# Patient Record
Sex: Female | Born: 1975 | Race: White | Hispanic: No | Marital: Married | Smoking: Former smoker
Health system: Southern US, Community
[De-identification: ages and names within clinical notes are randomized; demographics above are authoritative.]

## PROBLEM LIST (undated history)

## (undated) DIAGNOSIS — F419 Anxiety disorder, unspecified: Secondary | ICD-10-CM

## (undated) DIAGNOSIS — M419 Scoliosis, unspecified: Secondary | ICD-10-CM

## (undated) DIAGNOSIS — T7840XA Allergy, unspecified, initial encounter: Secondary | ICD-10-CM

## (undated) DIAGNOSIS — R921 Mammographic calcification found on diagnostic imaging of breast: Secondary | ICD-10-CM

## (undated) DIAGNOSIS — F329 Major depressive disorder, single episode, unspecified: Secondary | ICD-10-CM

## (undated) DIAGNOSIS — F32A Depression, unspecified: Secondary | ICD-10-CM

## (undated) DIAGNOSIS — R87619 Unspecified abnormal cytological findings in specimens from cervix uteri: Secondary | ICD-10-CM

## (undated) DIAGNOSIS — R112 Nausea with vomiting, unspecified: Secondary | ICD-10-CM

## (undated) DIAGNOSIS — Z9889 Other specified postprocedural states: Secondary | ICD-10-CM

## (undated) HISTORY — DX: Depression, unspecified: F32.A

## (undated) HISTORY — DX: Unspecified abnormal cytological findings in specimens from cervix uteri: R87.619

## (undated) HISTORY — DX: Major depressive disorder, single episode, unspecified: F32.9

## (undated) HISTORY — PX: CERVICAL BIOPSY  W/ LOOP ELECTRODE EXCISION: SUR135

## (undated) HISTORY — PX: BREAST SURGERY: SHX581

## (undated) HISTORY — DX: Allergy, unspecified, initial encounter: T78.40XA

## (undated) HISTORY — PX: OTHER SURGICAL HISTORY: SHX169

## (undated) HISTORY — DX: Scoliosis, unspecified: M41.9

## (undated) HISTORY — DX: Anxiety disorder, unspecified: F41.9

---

## 2001-06-30 ENCOUNTER — Ambulatory Visit (HOSPITAL_BASED_OUTPATIENT_CLINIC_OR_DEPARTMENT_OTHER): Admission: RE | Admit: 2001-06-30 | Discharge: 2001-07-01 | Payer: Self-pay | Admitting: Orthopedic Surgery

## 2001-11-26 HISTORY — PX: ANTERIOR CRUCIATE LIGAMENT REPAIR: SHX115

## 2003-03-29 ENCOUNTER — Other Ambulatory Visit: Admission: RE | Admit: 2003-03-29 | Discharge: 2003-03-29 | Payer: Self-pay | Admitting: Family Medicine

## 2004-08-30 ENCOUNTER — Other Ambulatory Visit: Admission: RE | Admit: 2004-08-30 | Discharge: 2004-08-30 | Payer: Self-pay | Admitting: Family Medicine

## 2005-10-12 ENCOUNTER — Other Ambulatory Visit: Admission: RE | Admit: 2005-10-12 | Discharge: 2005-10-12 | Payer: Self-pay | Admitting: Family Medicine

## 2006-10-29 ENCOUNTER — Other Ambulatory Visit: Admission: RE | Admit: 2006-10-29 | Discharge: 2006-10-29 | Payer: Self-pay | Admitting: Family Medicine

## 2012-10-13 ENCOUNTER — Ambulatory Visit (INDEPENDENT_AMBULATORY_CARE_PROVIDER_SITE_OTHER): Payer: BC Managed Care – PPO | Admitting: Family Medicine

## 2012-10-13 VITALS — BP 106/68 | HR 73 | Temp 98.4°F | Resp 16 | Ht 65.5 in | Wt 170.0 lb

## 2012-10-13 DIAGNOSIS — Z23 Encounter for immunization: Secondary | ICD-10-CM

## 2012-10-13 DIAGNOSIS — L709 Acne, unspecified: Secondary | ICD-10-CM

## 2012-10-13 DIAGNOSIS — J329 Chronic sinusitis, unspecified: Secondary | ICD-10-CM

## 2012-10-13 DIAGNOSIS — L708 Other acne: Secondary | ICD-10-CM

## 2012-10-13 MED ORDER — TRETINOIN 0.025 % EX CREA: TOPICAL_CREAM | Freq: Every day | CUTANEOUS | Status: DC

## 2012-10-13 MED ORDER — AMOXICILLIN 875 MG PO TABS: 875.0000 mg | ORAL_TABLET | Freq: Two times a day (BID) | ORAL | Status: DC

## 2012-10-13 NOTE — Patient Instructions (Signed)

## 2012-10-13 NOTE — Progress Notes (Signed)
@  [email or web address]@   Patient ID: Brittany Gillespie MRN: [id number], DOB: 01/13/1976, 36 y.o. Date of Encounter: 10/13/2012, 4:27 PM  Primary Physician: No primary provider on file.  Chief Complaint:  Chief Complaint  Patient presents with  . Generalized Body Aches    all symptoms began last Thursday- (Pt recently traveled by plane Tues and Wed)  . Cough    Pt taking OTC meds  . Chills  . Nasal Congestion  . Otalgia  . Fatigue    HPI: 36 y.o. year old female presents with 4 day history of nasal congestion, post nasal drip, sore throat, sinus pressure, and cough. Afebrile. No chills. Nasal congestion thick and green/yellow. Sinus pressure is the worst symptom. Cough is productive secondary to post nasal drip and not associated with time of day. Ears feel full, leading to sensation of muffled hearing. Has tried OTC cold preps without success. No GI complaints.   No recent antibiotics, recent travels, or sick contacts   No leg trauma, sedentary periods, h/o cancer, or tobacco use.  Past Medical History  Diagnosis Date  . Allergy   . Anxiety      Home Meds: Prior to Admission medications   Not on File    Allergies: No Known Allergies  History   Social History  . Marital Status: Single    Spouse Name: N/A    Number of Children: N/A  . Years of Education: N/A   Occupational History  . Sales    Social History Main Topics  . Smoking status: Never Smoker   . Smokeless tobacco: Not on file  . Alcohol Use: Yes  . Drug Use: No  . Sexually Active: Not on file   Other Topics Concern  . Not on file   Social History Narrative  . No narrative on file     Review of Systems: Constitutional: positive for chills, night sweats .Negative for weight changes Cardiovascular: negative for chest pain or palpitations Respiratory: negative for hemoptysis, wheezing, or shortness of breath Abdominal: negative for abdominal pain, nausea, vomiting or diarrhea Dermatological: negative for  rash Neurologic: negative for headache   Physical Exam: Blood pressure 106/68, pulse 73, temperature 98.4 F (36.9 C), temperature source Oral, resp. rate 16, height 5' 5.5" (1.664 m), weight 170 lb (77.111 kg), SpO2 98.00%., Body mass index is 27.86 kg/(m^2). General: Well developed, well nourished, in no acute distress. Head: Normocephalic, atraumatic, eyes without discharge, sclera non-icteric, nares are congested. Bilateral auditory canals clear, TM's are without perforation, pearly grey with reflective cone of light bilaterally. Serous effusion bilaterally behind TM's. Maxillary sinus TTP. Oral cavity moist, dentition normal. Posterior pharynx with post nasal drip and mild erythema. No peritonsillar abscess or tonsillar exudate. Neck: Supple. No thyromegaly. Full ROM. No lymphadenopathy. Lungs: Clear bilaterally to auscultation without wheezes, rales, or rhonchi. Breathing is unlabored.  Heart: RRR with S1 S2. No murmurs, rubs, or gallops appreciated. Msk:  Strength and tone normal for age. Extremities: No clubbing or cyanosis. No edema. Neuro: Alert and oriented X 3. Moves all extremities spontaneously. CNII-XII grossly in tact. Psych:  Responds to questions appropriately with a normal affect.    ASSESSMENT AND PLAN:  36 y.o. year old female with sinusitis 1. Sinusitis  amoxicillin (AMOXIL) 875 MG tablet, DISCONTINUED: amoxicillin (AMOXIL) 875 MG tablet    -  -Tylenol/Motrin prn -Rest/fluids -RTC precautions -RTC 3-5 days if no improvement  Signed, Elvina Sidle, MD 10/13/2012 4:27 PM

## 2012-10-15 ENCOUNTER — Telehealth: Payer: Self-pay

## 2012-10-15 NOTE — Telephone Encounter (Signed)
Pt states that her pharmacy informed her that she was waiting on a prior authorization from [place] for one of the two medications we prescribed, pt would like to know that status of that prior authorization. Best# [phone]

## 2012-10-15 NOTE — Progress Notes (Signed)
Called pt and got info about other acne medications she has tried/failed and then completed prior auth form for her Tretinoin cream and faxed to ins w/confirmation.  Received denial letter for medication. Ins does not cover this medication. Notified pt and faxed denial to pharmacy.

## 2012-10-16 ENCOUNTER — Telehealth: Payer: Self-pay

## 2012-10-16 NOTE — Telephone Encounter (Signed)
Called Brittany Gillespie to notify her that her prior auth for tretinoin cream was denied. D/W Brittany Gillespie that she can check costs at various pharmacies to see about paying OOP. Brittany Gillespie stated she will call to check costs. Dr L, is there anything else that we can Rx for Brittany Gillespie? She is currently using the Differin and it is not controlling her acne and she has discussed Doxy or Minocycline before w/providers, but was not put on that because it could interfere w/her BCP.

## 2012-10-16 NOTE — Telephone Encounter (Signed)
Patient advised this was denied

## 2012-10-17 NOTE — Telephone Encounter (Signed)
Clindamycin gel 1% qhs, 60 gms, would be a reasonable substitute.  Ok to call in with 2 RF

## 2012-10-20 MED ORDER — CLINDAMYCIN PHOSPHATE 1 % EX GEL: CUTANEOUS | Status: DC

## 2012-10-20 NOTE — Telephone Encounter (Signed)
Notified pt that new Rx was sent to pharmacy.

## 2013-04-10 ENCOUNTER — Telehealth: Payer: Self-pay | Admitting: Obstetrics and Gynecology

## 2013-04-10 NOTE — Telephone Encounter (Signed)
Advised patient we cannot refill OCP's until we see her.  She has New patient AEX scheduled for 04-13-13.

## 2013-04-10 NOTE — Telephone Encounter (Signed)
Patient called about getting prescription refilled information transferred to another office.

## 2013-04-10 NOTE — Telephone Encounter (Signed)
Patient saw Dr. Edward Jolly for her aex in June 2013 (at the other office). She said Dr. Edward Jolly told her she would not need a pap smear this year. She was wondering if she would be able to get a refill on her birth control. She currently has 1 refill left. Please advise.

## 2013-04-10 NOTE — Telephone Encounter (Signed)
LMTCB. Advised patient that she will need to schedule a NGYN appointment with Dr. Edward Jolly before she can receive a new prescription. Asked her to call back to schedule a new patient appointment.

## 2013-04-13 ENCOUNTER — Encounter: Payer: Self-pay | Admitting: Obstetrics and Gynecology

## 2013-04-13 ENCOUNTER — Ambulatory Visit (INDEPENDENT_AMBULATORY_CARE_PROVIDER_SITE_OTHER): Payer: BC Managed Care – PPO | Admitting: Obstetrics and Gynecology

## 2013-04-13 VITALS — BP 122/76 | Ht 65.5 in | Wt 175.0 lb

## 2013-04-13 DIAGNOSIS — Z3041 Encounter for surveillance of contraceptive pills: Secondary | ICD-10-CM

## 2013-04-13 MED ORDER — NORETHINDRONE 0.35 MG PO TABS: 1.0000 | ORAL_TABLET | Freq: Every day | ORAL | Status: DC

## 2013-04-13 NOTE — Progress Notes (Signed)
Patient ID: Brittany Gillespie, female   DOB: Jul 16, 1976, 37 y.o.   MRN: [id number]  37 y.o.   Single    Caucasian   female   G0P0   here for birth control refill.  Overall is very happy with her progesterone only OCP.  Does not have menses.  Takes on time. Declines future childbearing. Patient notices decreased sex drive last couple of months.  Husband with new job.  Patient with new responsibilities at work. Had cystic acne but this is improved in the last couple of months.   Off Clindamycin.   Quit tobacco one and a half year ago.   Running now.  No LMP recorded. Patient is not currently having periods (Reason: Oral contraceptives).          Sexually active: yes  The current method of family planning is oral progesterone-only contraceptive.    Exercising:  Run, spin, elliptical Last mammogram:   Last pap smear:  June 2013. History of abnormal pap: Years go.  No treatment. Smoking: no Alcohol:  5 wine or beer per week Last tetanus shot:  2005 Last cholesterol check:  June 2013 - normal.      Family History  Problem Relation Age of Onset  . Alzheimer's disease Maternal Grandmother   . Cancer Maternal Grandmother     breast cancer  . Stroke Maternal Grandfather   . Heart disease Maternal Grandfather   . Glaucoma Paternal Grandmother   . Diabetes Paternal Grandfather     There are no active problems to display for this patient.   Past Medical History  Diagnosis Date  . Allergy   . Anxiety   . Depression   . Dysmenorrhea     Past Surgical History  Procedure Laterality Date  . Anterior cruciate ligament repair  2001    Rt knee    Allergies: Review of patient's allergies indicates no known allergies.  Current Outpatient Prescriptions  Medication Sig Dispense Refill  . [name] 0.35 MG tablet 1 tablet daily.      Marland Kitchen loratadine (CLARITIN) 10 MG tablet Take 10 mg by mouth daily.      Marland Kitchen amoxicillin (AMOXIL) 875 MG tablet Take 1 tablet (875 mg total) by mouth 2 (two) times  daily.  20 tablet  0  . clindamycin (CLINDAGEL) 1 % gel Apply topically every night  60 g  2  . tretinoin (RETIN-A) 0.025 % cream Apply topically at bedtime.  45 g  4   No current facility-administered medications for this visit.    ROS: Pertinent items are noted in HPI.  Social Hx:  Married.  Works in Airline pilot.  Exam:    BP 122/76  Ht 5' 5.5" (1.664 m)  Wt 175 lb (79.379 kg)  BMI 28.67 kg/m2   Wt Readings from Last 3 Encounters:  04/13/13 175 lb (79.379 kg)  10/13/12 170 lb (77.111 kg)     Ht Readings from Last 3 Encounters:  04/13/13 5' 5.5" (1.664 m)  10/13/12 5' 5.5" (1.664 m)   Assessment  Need for contraception. Doing well on progesterone only OCP.  Likes amenorrhea. Successful tobacco cessation.  Plan  The patient and I reviewed options for reversible contraception including IUDs. Continue progesterone only OCP.  Rx for 2 months. AEX due in 2 months.  An After Visit Summary was printed and given to the patient.

## 2013-04-13 NOTE — Patient Instructions (Signed)
Continue with your current pill.  I will see you in 2 months for your annual exam.

## 2013-06-03 ENCOUNTER — Other Ambulatory Visit: Payer: Self-pay | Admitting: Obstetrics and Gynecology

## 2013-06-03 NOTE — Telephone Encounter (Signed)
eScribe request for refill on [name] Last filled - 04/13/13 X 2 packs Last AEX - 05/2012 Next AEX - 06/15/13 1 pack sent to pharmacy to make it to appt.

## 2013-06-15 ENCOUNTER — Encounter: Payer: Self-pay | Admitting: Obstetrics and Gynecology

## 2013-06-15 ENCOUNTER — Ambulatory Visit (INDEPENDENT_AMBULATORY_CARE_PROVIDER_SITE_OTHER): Payer: BC Managed Care – PPO | Admitting: Obstetrics and Gynecology

## 2013-06-15 VITALS — BP 120/80 | HR 70 | Ht 65.75 in | Wt 181.5 lb

## 2013-06-15 DIAGNOSIS — Z0000 Encounter for general adult medical examination without abnormal findings: Secondary | ICD-10-CM

## 2013-06-15 DIAGNOSIS — Z01419 Encounter for gynecological examination (general) (routine) without abnormal findings: Secondary | ICD-10-CM

## 2013-06-15 LAB — POCT URINALYSIS DIPSTICK
Blood, UA: NEGATIVE
Protein, UA: NEGATIVE
Urobilinogen, UA: NEGATIVE
pH, UA: 5

## 2013-06-15 LAB — COMPREHENSIVE METABOLIC PANEL
AST: 19 U/L (ref 0–37)
Albumin: 4.3 g/dL (ref 3.5–5.2)
BUN: 13 mg/dL (ref 6–23)
CO2: 26 mEq/L (ref 19–32)
Calcium: 9.5 mg/dL (ref 8.4–10.5)
Chloride: 103 mEq/L (ref 96–112)
Creat: 0.67 mg/dL (ref 0.50–1.10)
Glucose, Bld: 98 mg/dL (ref 70–99)
Potassium: 4 mEq/L (ref 3.5–5.3)

## 2013-06-15 LAB — LIPID PANEL
HDL: 75 mg/dL (ref >39)
Triglycerides: 37 mg/dL (ref <150)

## 2013-06-15 LAB — CBC
HCT: 37.2 % (ref 36.0–46.0)
Hemoglobin: 12.4 g/dL (ref 12.0–15.0)
MCV: 87.7 fL (ref 78.0–100.0)
RDW: 14.1 % (ref 11.5–15.5)
WBC: 7.6 10*3/uL (ref 4.0–10.5)

## 2013-06-15 LAB — HEMOGLOBIN, FINGERSTICK: Hemoglobin, fingerstick: 12.3 g/dL (ref 12.0–16.0)

## 2013-06-15 MED ORDER — NORETHINDRONE 0.35 MG PO TABS: 1.0000 | ORAL_TABLET | Freq: Every day | ORAL | Status: DC

## 2013-06-15 NOTE — Progress Notes (Signed)
Patient ID: Brittany Gillespie, female   DOB: 1976/01/12, 37 y.o.   MRN: [id number] 37 y.o.   Single    Caucasian   female   G0P0   here for annual exam.   Left axillary discomfort - new exercise routine and doing lifting to help her mother with a move to [place]. 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    No LMP recorded. Patient is not currently having periods (Reason: Oral contraceptives).          Sexually active: yes  The current method of family planning is OCP - progesterone only.     Exercising: sping, running, walking and kick boxing. Last mammogram:  never Last pap smear: 04/2012 wnl History of abnormal pap: Yes 8-10 years ago had colposcopy with biopsies.   Had LEEP at [facility].  Paps reverted back to normal. Smoking: former smoker: quit 2013 Marijuana:  occasional Alcohol: 5-6 beers per week Last colonoscopy: never Last Bone Density:  never Last tetanus shot: 08/2004 Last cholesterol check: ?never  Hgb:                Urine: Neg   Family History  Problem Relation Age of Onset  . Alzheimer's disease Maternal Grandmother   . Cancer Maternal Grandmother     breast cancer  . Stroke Maternal Grandfather   . Heart disease Maternal Grandfather   . Glaucoma Paternal Grandmother   . Diabetes Paternal Grandfather     There are no active problems to display for this patient.   Past Medical History  Diagnosis Date  . Allergy   . Anxiety   . Depression   . Dysmenorrhea     Past Surgical History  Procedure Laterality Date  . Anterior cruciate ligament repair  2001    Rt knee    Allergies: Review of patient's allergies indicates no known allergies.  Current Outpatient Prescriptions  Medication Sig Dispense Refill  . clindamycin (CLINDAGEL) 1 % gel Apply topically every night  60 g  2  . ibuprofen (ADVIL,MOTRIN) 200 MG tablet Take 200 mg by mouth every 6 (six) hours as needed for pain.      Marland Kitchen loratadine (CLARITIN) 10  MG tablet Take 10 mg by mouth daily.      Marland Kitchen tretinoin (RETIN-A) 0.025 % cream Apply topically at bedtime.  45 g  4  . amoxicillin (AMOXIL) 875 MG tablet Take 1 tablet (875 mg total) by mouth 2 (two) times daily.  20 tablet  0  . NORA-BE 0.35 MG tablet TAKE 1 TABLET BY MOUTH DAILY  28 tablet  0   No current facility-administered medications for this visit.    ROS: Pertinent items are noted in HPI.  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  Exam:    BP 120/80  Pulse 70  Ht 5' 5.75" (1.67 m)  Wt 181 lb 8 oz (82.328 kg)  BMI 29.52 kg/m2   Wt Readings from Last 3 Encounters:  06/15/13 181 lb 8 oz (82.328 kg)  04/13/13 175 lb (79.379 kg)  10/13/12 170 lb (77.111 kg)     Ht Readings from Last 3 Encounters:  06/15/13 5' 5.75" (1.67 m)  04/13/13 5' 5.5" (1.664 m)  10/13/12 5' 5.5" (1.664 m)    General appearance: alert, cooperative and appears stated age Head: Normocephalic, without obvious abnormality, atraumatic Neck: no adenopathy, supple, symmetrical, trachea midline and thyroid not enlarged, symmetric, no tenderness/mass/nodules Lungs: clear to auscultation bilaterally  Breasts: Inspection negative, No nipple retraction or dimpling, No nipple discharge or bleeding, No axillary or supraclavicular adenopathy, Normal to palpation without dominant masses Heart: regular rate and rhythm Abdomen: soft, non-tender;  no masses,  no organomegaly Extremities: extremities normal, atraumatic, no cyanosis or edema Skin: Skin color, texture, turgor normal. No rashes or lesions Lymph nodes: Cervical, supraclavicular, and axillary nodes normal. No abnormal inguinal nodes palpated Neurologic: Grossly normal   Pelvic: External genitalia:  no lesions              Urethra:  normal appearing urethra with no masses, tenderness or lesions              Bartholins and Skenes: normal                 Vagina: normal appearing vagina with normal color and discharge, no lesions               Cervix: normal appearance              Pap taken: yes and high risk HPV.        Bimanual Exam:  Uterus:  uterus is normal size, shape, consistency and nontender                                      Adnexa: normal adnexa in size, nontender and no masses                                        A: normal  gynecologic exam History of LEEP procedure     P: mammogram age 23 years old. pap smear and high risk HPV annually [name] - progesterone only contraceptive - 3 months with 3 refills. Lipid profile, CBC, CMP. return annually or prn     An After Visit Summary was printed and given to the patient.

## 2013-06-15 NOTE — Patient Instructions (Signed)

## 2013-06-17 NOTE — Addendum Note (Signed)
Addended by: Conley Simmonds on: 06/17/2013 07:41 AM   Modules accepted: Orders

## 2013-06-19 LAB — IPS PAP TEST WITH HPV

## 2013-07-22 ENCOUNTER — Telehealth: Payer: Self-pay | Admitting: Obstetrics and Gynecology

## 2013-10-01 ENCOUNTER — Other Ambulatory Visit: Payer: Self-pay

## 2013-11-12 ENCOUNTER — Telehealth: Payer: Self-pay | Admitting: Obstetrics and Gynecology

## 2013-11-12 NOTE — Telephone Encounter (Signed)
Patient is calling Shanda Bumps to let her know that she spoke with her insurance and they told her the claim was processed 11/09/13 and paid $211.00 with no patient responsibility. Any questions please call back.

## 2014-03-10 ENCOUNTER — Encounter: Payer: Self-pay | Admitting: Obstetrics and Gynecology

## 2014-05-31 ENCOUNTER — Other Ambulatory Visit: Payer: Self-pay | Admitting: Obstetrics and Gynecology

## 2014-05-31 MED ORDER — NORETHINDRONE 0.35 MG PO TABS: 1.0000 | ORAL_TABLET | Freq: Every day | ORAL | Status: DC

## 2014-05-31 NOTE — Telephone Encounter (Signed)
Nora-Be refill request from pharmacy.  Called pt and states she is taking [name].  Nora-Be Refused. [name] (Micronor) refill sent to [facility]. - Patient agreed.  She just needs 2 months worth she'll be coming to her AEX 07/05/14 and would like to talk about getting IUD.  Encounter closed.  Dr. Antony Blackbird

## 2014-06-18 ENCOUNTER — Ambulatory Visit: Payer: BC Managed Care – PPO | Admitting: Obstetrics and Gynecology

## 2014-06-26 DIAGNOSIS — R921 Mammographic calcification found on diagnostic imaging of breast: Secondary | ICD-10-CM

## 2014-06-26 HISTORY — DX: Mammographic calcification found on diagnostic imaging of breast: R92.1

## 2014-07-01 ENCOUNTER — Ambulatory Visit: Payer: BC Managed Care – PPO | Admitting: Obstetrics and Gynecology

## 2014-07-05 ENCOUNTER — Ambulatory Visit (INDEPENDENT_AMBULATORY_CARE_PROVIDER_SITE_OTHER): Payer: BC Managed Care – PPO | Admitting: Obstetrics and Gynecology

## 2014-07-05 ENCOUNTER — Encounter: Payer: Self-pay | Admitting: Obstetrics and Gynecology

## 2014-07-05 VITALS — BP 110/76 | HR 70 | Resp 14 | Ht 65.0 in | Wt 161.0 lb

## 2014-07-05 DIAGNOSIS — Z309 Encounter for contraceptive management, unspecified: Secondary | ICD-10-CM

## 2014-07-05 DIAGNOSIS — R0789 Other chest pain: Secondary | ICD-10-CM

## 2014-07-05 DIAGNOSIS — Z01419 Encounter for gynecological examination (general) (routine) without abnormal findings: Secondary | ICD-10-CM

## 2014-07-05 DIAGNOSIS — R071 Chest pain on breathing: Secondary | ICD-10-CM

## 2014-07-05 DIAGNOSIS — N644 Mastodynia: Secondary | ICD-10-CM

## 2014-07-05 DIAGNOSIS — Z0000 Encounter for general adult medical examination without abnormal findings: Secondary | ICD-10-CM

## 2014-07-05 LAB — POCT URINALYSIS DIPSTICK
BILIRUBIN UA: NEGATIVE
Glucose, UA: NEGATIVE
KETONES UA: NEGATIVE
Leukocytes, UA: NEGATIVE
Nitrite, UA: NEGATIVE
PH UA: 5
Protein, UA: NEGATIVE
RBC UA: NEGATIVE
Urobilinogen, UA: NEGATIVE

## 2014-07-05 LAB — HEMOGLOBIN, FINGERSTICK: Hemoglobin, fingerstick: 12.8 g/dL (ref 12.0–16.0)

## 2014-07-05 NOTE — Patient Instructions (Signed)

## 2014-07-05 NOTE — Progress Notes (Signed)
Patient ID: Brittany Gillespie, female   DOB: [birth year], 38 y.o.   MRN: [id number] GYNECOLOGY VISIT  PCP:   None  Referring provider:   HPI: 38 y.o.   Single  Caucasian  female   G0P0 with No LMP recorded. Patient is not currently having periods (Reason: Oral contraceptives).   here for  AEX.  Wants to consider the Mirena IUD.  Not having menses. Has acne.  Would like to lessen hormonal exposure.   Quit smoking 2 1/2 years ago.   Twinges under left arm, along chest wall and breast area, occurs with movement.  Feels internal. No lump felt in breast or under arm. Feels like a cramp or a twinge.  No prior mammogram.  Maternal grandmother had breast cancer.   Hgb:    12.8 Urine:  Neg  GYNECOLOGIC HISTORY: No LMP recorded. Patient is not currently having periods (Reason: Oral contraceptives). Sexually active:  yes Partner preference: female Contraception:  OCP's  Menopausal hormone therapy: n/a DES exposure:   no Blood transfusions:  no  Sexually transmitted diseases:  HPV in college  GYN procedures and prior surgeries:  LEEP 2005 approximately Last mammogram:  n/a               Last pap and high risk HPV testing:   06-17-13 wnl:neg HR HPV History of abnormal pap smear:  2005 had colposcopy with biopsies.  Had LEEP at [facility].  Paps reverted back to normal.   OB History   Grav Para Term Preterm Abortions TAB SAB Ect Mult Living   0                LIFESTYLE: Exercise:   Spin, walking and kick boxing            Tobacco:   no Alcohol:     8 beers per week Drug use:   Marijuana  occ.  OTHER HEALTH MAINTENANCE: Tetanus/TDap:  2005 Gardisil:              n/a Influenza:            08/2013 Zostavax:            n/a  Bone density:      n/a Colonoscopy:      n/a  Cholesterol check:   2014 normal  Family History  Problem Relation Age of Onset  . Alzheimer's disease Maternal Grandmother   . Cancer Maternal Grandmother     breast cancer  . Stroke Maternal  Grandfather   . Heart disease Maternal Grandfather   . Glaucoma Paternal Grandmother   . Diabetes Paternal Grandfather     There are no active problems to display for this patient.  Past Medical History  Diagnosis Date  . Allergy   . Anxiety   . Depression   . Dysmenorrhea     Past Surgical History  Procedure Laterality Date  . Anterior cruciate ligament repair  2001    Rt knee    ALLERGIES: Review of patient's allergies indicates no known allergies.  Current Outpatient Prescriptions  Medication Sig Dispense Refill  . ibuprofen (ADVIL,MOTRIN) 200 MG tablet Take 200 mg by mouth every 6 (six) hours as needed for pain.      Marland Kitchen loratadine (CLARITIN) 10 MG tablet Take 10 mg by mouth daily.      . norethindrone (MICRONOR,CAMILA,ERRIN) 0.35 MG tablet Take 1 tablet (0.35 mg total) by mouth daily.  3 Package  0   No current facility-administered medications for this  visit.     ROS:  Pertinent items are noted in HPI.  SOCIAL HISTORY:  Works in Nurse, learning disability.   PHYSICAL EXAMINATION:    BP 110/76  Pulse 70  Resp 14  Ht 5' 5"  (1.651 m)  Wt 161 lb (73.029 kg)  BMI 26.79 kg/m2   Wt Readings from Last 3 Encounters:  07/05/14 161 lb (73.029 kg)  06/15/13 181 lb 8 oz (82.328 kg)  04/13/13 175 lb (79.379 kg)     Ht Readings from Last 3 Encounters:  07/05/14 5' 5"  (1.651 m)  06/15/13 5' 5.75" (1.67 m)  04/13/13 5' 5.5" (1.664 m)    General appearance: alert, cooperative and appears stated age Head: Normocephalic, without obvious abnormality, atraumatic Neck: no adenopathy, supple, symmetrical, trachea midline and thyroid not enlarged, symmetric, no tenderness/mass/nodules Lungs: clear to auscultation bilaterally Breasts: Inspection negative, No nipple retraction or dimpling, No nipple discharge or bleeding, No axillary or supraclavicular adenopathy, Normal to palpation without dominant masses Heart: regular rate and rhythm Abdomen: soft, non-tender; no masses,  no  organomegaly Extremities: extremities normal, atraumatic, no cyanosis or edema Skin: Skin color, texture, turgor normal. No rashes or lesions Lymph nodes: Cervical, supraclavicular, and axillary nodes normal. No abnormal inguinal nodes palpated Neurologic: Grossly normal  Pelvic: External genitalia:  no lesions              Urethra:  normal appearing urethra with no masses, tenderness or lesions              Bartholins and Skenes: normal                 Vagina: normal appearing vagina with normal color and discharge, no lesions              Cervix: normal appearance              Pap and high risk HPV testing done: Yes.  .            Bimanual Exam:  Uterus:  uterus is normal size, shape, consistency and nontender                                      Adnexa: normal adnexa in size, nontender and no masses                                      Rectovaginal: Confirms                                      Anus:  normal sphincter tone, no lesions  ASSESSMENT  Normal gynecologic exam. Nulliparous female. History of LEEP.  Former smoker.  Acne with Micronor.  Left chest wall/breast pain.  Family history of breast cancer.   PLAN  Bilateral diagnostic mammogram and left breast ultrasound.  Pap smear and high risk HPV testing performed.  Counseled on self breast exam, Calcium and vitamin D intake. Discussion of Skyla and Mirena - risks and benefits.  Will precert for both and see which is appropriate for patient at the time of insertion.  Will pretreat with Cytotec and Ibuprofen and plan for paracervical block. Lipid profile, CMP, CBC. Return annually or prn   An After Visit Summary was printed and given  to the patient.

## 2014-07-06 ENCOUNTER — Telehealth: Payer: Self-pay | Admitting: Emergency Medicine

## 2014-07-06 LAB — COMPREHENSIVE METABOLIC PANEL
ALK PHOS: 56 U/L (ref 39–117)
ALT: 14 U/L (ref 0–35)
AST: 19 U/L (ref 0–37)
Albumin: 4.5 g/dL (ref 3.5–5.2)
BILIRUBIN TOTAL: 0.5 mg/dL (ref 0.2–1.2)
BUN: 11 mg/dL (ref 6–23)
CHLORIDE: 104 meq/L (ref 96–112)
CO2: 26 mEq/L (ref 19–32)
CREATININE: 0.71 mg/dL (ref 0.50–1.10)
Calcium: 9.1 mg/dL (ref 8.4–10.5)
Glucose, Bld: 91 mg/dL (ref 70–99)
POTASSIUM: 4.1 meq/L (ref 3.5–5.3)
Sodium: 138 mEq/L (ref 135–145)
Total Protein: 6.9 g/dL (ref 6.0–8.3)

## 2014-07-06 LAB — CBC
HEMATOCRIT: 37.2 % (ref 36.0–46.0)
HEMOGLOBIN: 12.7 g/dL (ref 12.0–15.0)
MCH: 30.1 pg (ref 26.0–34.0)
MCHC: 34.1 g/dL (ref 30.0–36.0)
MCV: 88.2 fL (ref 78.0–100.0)
Platelets: 278 10*3/uL (ref 150–400)
RBC: 4.22 MIL/uL (ref 3.87–5.11)
RDW: 13.7 % (ref 11.5–15.5)
WBC: 9.2 10*3/uL (ref 4.0–10.5)

## 2014-07-06 LAB — LIPID PANEL
Cholesterol: 151 mg/dL (ref 0–200)
HDL: 70 mg/dL (ref >39)
LDL CALC: 63 mg/dL (ref 0–99)
Total CHOL/HDL Ratio: 2.2 Ratio
Triglycerides: 90 mg/dL (ref <150)
VLDL: 18 mg/dL (ref 0–40)

## 2014-07-06 NOTE — Telephone Encounter (Signed)
Spoke with patient and she is agreeable with scheduling bilateral dx mammogram and L Breast US at [facility] for 07/09/14 at 0830.   Dx MMG Code Q2229  L Breast Ultrasound [id number]

## 2014-07-06 NOTE — Progress Notes (Signed)
Bilateral dx and L Breast Ultrasound scheduled for 07/09/14 at 0830. Patient agreeable to time/date/location.

## 2014-07-07 LAB — IPS PAP TEST WITH HPV

## 2014-07-08 NOTE — Telephone Encounter (Signed)
Spoke with patient. Advised that per benefit quote received, insertion and removal of iud will be covered at 100% of allowable (she will have 0 oop expense). Advised that per the quote received, the device must be prescrbed and picked up from the pharmacy and brought into our office for insertion. Advised that based on the information received, I am unable to quote benefits regarding coverage of the device itself.  Patient will contact both medical/rx payer for additional details regarding coverage of device. Patient will then, contact our office back for rx if she decides to proceed.

## 2014-07-08 NOTE — Telephone Encounter (Signed)
Patient is calling to check about precert for her IUD

## 2014-07-08 NOTE — Telephone Encounter (Signed)
Patient called. She states she talked with her insurance and Mirena iud is covered at !00% if purchased at outpatient pharmacy.  [facility], they will not stock or order it.  [facility] pharmacy, they will check benefits and call back for order if covered.  Faxed patient insurance cards over to [facility].

## 2014-07-09 ENCOUNTER — Ambulatory Visit
Admission: RE | Admit: 2014-07-09 | Discharge: 2014-07-09 | Disposition: A | Discharge status: Home / Self Care | Payer: BC Managed Care – PPO | Source: Ambulatory Visit | Attending: Obstetrics and Gynecology | Admitting: Obstetrics and Gynecology

## 2014-07-09 DIAGNOSIS — R0789 Other chest pain: Secondary | ICD-10-CM

## 2014-07-09 DIAGNOSIS — N644 Mastodynia: Secondary | ICD-10-CM

## 2014-07-12 NOTE — Telephone Encounter (Signed)
Patient has coverage for future scripts in [place].  Order can be sent escribed when patient ready.  Spoke with patient. She had recent breast imaging and will see general surgery next week.  She will call back when she is ready to place order for Mirena. I advised we will need to place order for Mirena and then she will need to call back when she is on her cycle to place iud. She is agreeable.  Routing to provider for final review. Patient agreeable to disposition. Will close encounter

## 2014-07-12 NOTE — Telephone Encounter (Signed)
Will need to wait for pathology report from the biopsy prior to proceeding with any hormonal prescription.

## 2014-07-22 ENCOUNTER — Ambulatory Visit (INDEPENDENT_AMBULATORY_CARE_PROVIDER_SITE_OTHER): Payer: BC Managed Care – PPO | Admitting: General Surgery

## 2014-07-22 ENCOUNTER — Encounter (INDEPENDENT_AMBULATORY_CARE_PROVIDER_SITE_OTHER): Payer: Self-pay | Admitting: General Surgery

## 2014-07-22 VITALS — BP 118/80 | HR 76 | Temp 98.2°F | Ht 66.0 in | Wt 158.0 lb

## 2014-07-22 DIAGNOSIS — R928 Other abnormal and inconclusive findings on diagnostic imaging of breast: Secondary | ICD-10-CM

## 2014-07-22 DIAGNOSIS — R921 Mammographic calcification found on diagnostic imaging of breast: Secondary | ICD-10-CM | POA: Insufficient documentation

## 2014-07-22 NOTE — Progress Notes (Signed)
Patient ID: Brittany Gillespie, female   DOB: Aug 11, 1976, 38 y.o.   MRN: [id number]  Chief Complaint  Patient presents with  . eval left breast    HPI Brittany Gillespie is a 38 y.o. female.  We are asked to see the patient in consultation by Dr. Derrel Nip to evaluate her for calcifications in her upper outer left breast. The patient is a 38 year old white female who recently complained to her medical doctor of some pain in the left breast. The pain was described as an unusual 8. It as an intermittent pain that comes and goes. She expresses the pain maybe once every week or 2. She has not had any discharge from her nipple. She was evaluated with mammogram that showed some abnormal 1 cm area of calcifications in the upper outer left breast. Unfortunately these were too deep to be biopsied by stereotactic guidance. She is going to require an open biopsy of this area.  HPI  Past Medical History  Diagnosis Date  . Allergy   . Anxiety   . Depression   . Dysmenorrhea     Past Surgical History  Procedure Laterality Date  . Anterior cruciate ligament repair  2001    Rt knee    Family History  Problem Relation Age of Onset  . Alzheimer's disease Maternal Grandmother   . Cancer Maternal Grandmother     breast cancer  . Stroke Maternal Grandfather   . Heart disease Maternal Grandfather   . Glaucoma Paternal Grandmother   . Diabetes Paternal Grandfather     Social History History  Substance Use Topics  . Smoking status: Former Smoker    Quit date: 11/27/2011  . Smokeless tobacco: Not on file  . Alcohol Use: 4.0 oz/week    8 drink(s) per week     Comment: 8 glasses of wine or beers per week    No Known Allergies  Current Outpatient Prescriptions  Medication Sig Dispense Refill  . ibuprofen (ADVIL,MOTRIN) 200 MG tablet Take 200 mg by mouth every 6 (six) hours as needed for pain.      Marland Kitchen loratadine (CLARITIN) 10 MG tablet Take 10 mg by mouth daily.      . norethindrone (MICRONOR,CAMILA,ERRIN)  0.35 MG tablet Take 1 tablet (0.35 mg total) by mouth daily.  3 Package  0   No current facility-administered medications for this visit.    Review of Systems Review of Systems  Constitutional: Negative.   HENT: Negative.   Eyes: Negative.   Respiratory: Negative.   Cardiovascular: Negative.   Gastrointestinal: Negative.   Endocrine: Negative.   Genitourinary: Negative.   Musculoskeletal: Negative.   Skin: Negative.   Allergic/Immunologic: Negative.   Neurological: Negative.   Hematological: Negative.   Psychiatric/Behavioral: Negative.     Blood pressure 118/80, pulse 76, temperature 98.2 F (36.8 C), height 5' 6"  (1.676 m), weight 158 lb (71.668 kg).  Physical Exam Physical Exam  Constitutional: She is oriented to person, place, and time. She appears well-developed and well-nourished.  HENT:  Head: Normocephalic and atraumatic.  Eyes: Conjunctivae and EOM are normal. Pupils are equal, round, and reactive to light.  Neck: Normal range of motion. Neck supple.  Cardiovascular: Normal rate, regular rhythm and normal heart sounds.   Pulmonary/Chest: Effort normal and breath sounds normal.  There is no palpable mass in either breast. There is one small subcentimeter mobile palpable lymph node in the left axilla  Abdominal: Soft. Bowel sounds are normal.  Musculoskeletal: Normal range of motion.  Lymphadenopathy:  She has no cervical adenopathy.  Neurological: She is alert and oriented to person, place, and time.  Skin: Skin is warm and dry.  Psychiatric: She has a normal mood and affect. Her behavior is normal.    Data Reviewed As above  Assessment    The patient appears to have a small area of abnormal calcifications in the upper outer left breast that were not amenable to stereotactic biopsy. Because of this she will require an open biopsy of this area. I think she would be a good candidate for a radioactive seed localized lumpectomy. I have discussed with her in  detail the risks and benefits of the operation to do this as well as some of the technical aspects and she understands and wishes to proceed     Plan    Plan for left breast radioactive seed localized lumpectomy        TOTH III,PAUL S 07/22/2014, 3:31 PM

## 2014-07-26 ENCOUNTER — Encounter: Payer: Self-pay | Admitting: Obstetrics and Gynecology

## 2014-07-26 ENCOUNTER — Encounter (HOSPITAL_BASED_OUTPATIENT_CLINIC_OR_DEPARTMENT_OTHER): Payer: Self-pay | Admitting: *Deleted

## 2014-07-26 ENCOUNTER — Other Ambulatory Visit (INDEPENDENT_AMBULATORY_CARE_PROVIDER_SITE_OTHER): Payer: Self-pay | Admitting: General Surgery

## 2014-07-26 DIAGNOSIS — R921 Mammographic calcification found on diagnostic imaging of breast: Secondary | ICD-10-CM

## 2014-08-03 ENCOUNTER — Ambulatory Visit
Admission: RE | Admit: 2014-08-03 | Discharge: 2014-08-03 | Disposition: A | Discharge status: Home / Self Care | Payer: BC Managed Care – PPO | Source: Ambulatory Visit | Attending: General Surgery | Admitting: General Surgery

## 2014-08-03 DIAGNOSIS — R921 Mammographic calcification found on diagnostic imaging of breast: Secondary | ICD-10-CM

## 2014-08-04 ENCOUNTER — Encounter (HOSPITAL_BASED_OUTPATIENT_CLINIC_OR_DEPARTMENT_OTHER): Payer: Self-pay | Admitting: Certified Registered"

## 2014-08-04 ENCOUNTER — Ambulatory Visit (HOSPITAL_BASED_OUTPATIENT_CLINIC_OR_DEPARTMENT_OTHER)
Admission: RE | Admit: 2014-08-04 | Discharge: 2014-08-04 | Disposition: A | Discharge status: Home / Self Care | Payer: BC Managed Care – PPO | Source: Ambulatory Visit | Attending: General Surgery | Admitting: General Surgery

## 2014-08-04 ENCOUNTER — Ambulatory Visit
Admit: 2014-08-04 | Discharge: 2014-08-04 | Disposition: A | Discharge status: Home / Self Care | Payer: BC Managed Care – PPO | Attending: General Surgery | Admitting: General Surgery

## 2014-08-04 ENCOUNTER — Ambulatory Visit
Admission: RE | Admit: 2014-08-04 | Discharge: 2014-08-04 | Disposition: A | Discharge status: Home / Self Care | Payer: BC Managed Care – PPO | Source: Ambulatory Visit | Attending: General Surgery | Admitting: General Surgery

## 2014-08-04 ENCOUNTER — Ambulatory Visit (HOSPITAL_BASED_OUTPATIENT_CLINIC_OR_DEPARTMENT_OTHER): Payer: BC Managed Care – PPO | Admitting: Certified Registered"

## 2014-08-04 ENCOUNTER — Other Ambulatory Visit: Payer: Self-pay | Admitting: Obstetrics and Gynecology

## 2014-08-04 ENCOUNTER — Encounter (HOSPITAL_BASED_OUTPATIENT_CLINIC_OR_DEPARTMENT_OTHER)
Admission: RE | Disposition: A | Discharge status: Home / Self Care | Payer: Self-pay | Source: Ambulatory Visit | Attending: General Surgery

## 2014-08-04 ENCOUNTER — Encounter (HOSPITAL_BASED_OUTPATIENT_CLINIC_OR_DEPARTMENT_OTHER): Payer: BC Managed Care – PPO | Admitting: Certified Registered"

## 2014-08-04 DIAGNOSIS — R921 Mammographic calcification found on diagnostic imaging of breast: Secondary | ICD-10-CM

## 2014-08-04 DIAGNOSIS — F411 Generalized anxiety disorder: Secondary | ICD-10-CM | POA: Diagnosis not present

## 2014-08-04 DIAGNOSIS — N6019 Diffuse cystic mastopathy of unspecified breast: Secondary | ICD-10-CM | POA: Diagnosis not present

## 2014-08-04 DIAGNOSIS — R928 Other abnormal and inconclusive findings on diagnostic imaging of breast: Secondary | ICD-10-CM | POA: Diagnosis present

## 2014-08-04 DIAGNOSIS — Z87891 Personal history of nicotine dependence: Secondary | ICD-10-CM | POA: Insufficient documentation

## 2014-08-04 DIAGNOSIS — F3289 Other specified depressive episodes: Secondary | ICD-10-CM | POA: Diagnosis not present

## 2014-08-04 DIAGNOSIS — F329 Major depressive disorder, single episode, unspecified: Secondary | ICD-10-CM | POA: Diagnosis not present

## 2014-08-04 HISTORY — DX: Mammographic calcification found on diagnostic imaging of breast: R92.1

## 2014-08-04 HISTORY — DX: Other specified postprocedural states: Z98.890

## 2014-08-04 HISTORY — PX: BREAST LUMPECTOMY WITH RADIOACTIVE SEED LOCALIZATION: SHX6424

## 2014-08-04 HISTORY — DX: Other specified postprocedural states: R11.2

## 2014-08-04 LAB — POCT HEMOGLOBIN-HEMACUE: Hemoglobin: 13.6 g/dL (ref 12.0–15.0)

## 2014-08-04 SURGERY — BREAST LUMPECTOMY WITH RADIOACTIVE SEED LOCALIZATION
Anesthesia: General | Site: Breast | Laterality: Left

## 2014-08-04 MED ORDER — CEFAZOLIN SODIUM-DEXTROSE 2-3 GM-% IV SOLR
INTRAVENOUS | Status: AC
  Filled 2014-08-04: qty 50

## 2014-08-04 MED ORDER — CHLORHEXIDINE GLUCONATE 4 % EX LIQD: 1.0000 "application " | Freq: Once | CUTANEOUS | Status: DC

## 2014-08-04 MED ORDER — OXYCODONE HCL 5 MG PO TABS: 5.0000 mg | ORAL_TABLET | Freq: Once | ORAL | Status: DC | PRN

## 2014-08-04 MED ORDER — SCOPOLAMINE 1 MG/3DAYS TD PT72
1.0000 | MEDICATED_PATCH | TRANSDERMAL | Status: DC
  Administered 2014-08-04: 1.5 mg via TRANSDERMAL

## 2014-08-04 MED ORDER — CEFAZOLIN SODIUM-DEXTROSE 2-3 GM-% IV SOLR
2.0000 g | INTRAVENOUS | Status: AC
  Administered 2014-08-04: 2 g via INTRAVENOUS

## 2014-08-04 MED ORDER — FENTANYL CITRATE 0.05 MG/ML IJ SOLN
INTRAMUSCULAR | Status: DC | PRN
  Administered 2014-08-04 (×4): 50 ug via INTRAVENOUS

## 2014-08-04 MED ORDER — MIDAZOLAM HCL 2 MG/2ML IJ SOLN
INTRAMUSCULAR | Status: AC
  Filled 2014-08-04: qty 2

## 2014-08-04 MED ORDER — PROPOFOL 10 MG/ML IV BOLUS
INTRAVENOUS | Status: AC
  Filled 2014-08-04: qty 20

## 2014-08-04 MED ORDER — FENTANYL CITRATE 0.05 MG/ML IJ SOLN
INTRAMUSCULAR | Status: AC
  Filled 2014-08-04: qty 4

## 2014-08-04 MED ORDER — OXYCODONE-ACETAMINOPHEN 5-325 MG PO TABS: 1.0000 | ORAL_TABLET | ORAL | Status: DC | PRN

## 2014-08-04 MED ORDER — LIDOCAINE HCL (CARDIAC) 20 MG/ML IV SOLN
INTRAVENOUS | Status: DC | PRN
  Administered 2014-08-04: 80 mg via INTRAVENOUS

## 2014-08-04 MED ORDER — BUPIVACAINE HCL (PF) 0.25 % IJ SOLN
INTRAMUSCULAR | Status: AC
  Filled 2014-08-04: qty 30

## 2014-08-04 MED ORDER — SCOPOLAMINE 1 MG/3DAYS TD PT72
MEDICATED_PATCH | TRANSDERMAL | Status: AC
  Filled 2014-08-04: qty 1

## 2014-08-04 MED ORDER — PROPOFOL 10 MG/ML IV BOLUS
INTRAVENOUS | Status: DC | PRN
  Administered 2014-08-04: 180 mg via INTRAVENOUS

## 2014-08-04 MED ORDER — LACTATED RINGERS IV SOLN
INTRAVENOUS | Status: DC
  Administered 2014-08-04 (×2): via INTRAVENOUS

## 2014-08-04 MED ORDER — 0.9 % SODIUM CHLORIDE (POUR BTL) OPTIME
TOPICAL | Status: DC | PRN
  Administered 2014-08-04: 300 mL

## 2014-08-04 MED ORDER — HYDROMORPHONE HCL PF 1 MG/ML IJ SOLN
0.2500 mg | INTRAMUSCULAR | Status: DC | PRN
  Administered 2014-08-04 (×2): 0.5 mg via INTRAVENOUS
  Administered 2014-08-04: 0.25 mg via INTRAVENOUS

## 2014-08-04 MED ORDER — HYDROMORPHONE HCL PF 1 MG/ML IJ SOLN
INTRAMUSCULAR | Status: AC
  Filled 2014-08-04: qty 1

## 2014-08-04 MED ORDER — MIDAZOLAM HCL 5 MG/5ML IJ SOLN
INTRAMUSCULAR | Status: DC | PRN
  Administered 2014-08-04: 2 mg via INTRAVENOUS

## 2014-08-04 MED ORDER — ONDANSETRON HCL 4 MG/2ML IJ SOLN: 4.0000 mg | Freq: Four times a day (QID) | INTRAMUSCULAR | Status: DC | PRN

## 2014-08-04 MED ORDER — DEXAMETHASONE SODIUM PHOSPHATE 4 MG/ML IJ SOLN
INTRAMUSCULAR | Status: DC | PRN
  Administered 2014-08-04: 10 mg via INTRAVENOUS

## 2014-08-04 MED ORDER — BUPIVACAINE HCL (PF) 0.25 % IJ SOLN
INTRAMUSCULAR | Status: DC | PRN
  Administered 2014-08-04: 10 mL

## 2014-08-04 MED ORDER — OXYCODONE HCL 5 MG/5ML PO SOLN: 5.0000 mg | Freq: Once | ORAL | Status: DC | PRN

## 2014-08-04 SURGICAL SUPPLY — 48 items
APPLIER CLIP 9.375 MED OPEN (MISCELLANEOUS)
BINDER BREAST LRG (GAUZE/BANDAGES/DRESSINGS) IMPLANT
BINDER BREAST MEDIUM (GAUZE/BANDAGES/DRESSINGS) IMPLANT
BINDER BREAST XLRG (GAUZE/BANDAGES/DRESSINGS) IMPLANT
BINDER BREAST XXLRG (GAUZE/BANDAGES/DRESSINGS) IMPLANT
BLADE SURG 15 STRL LF DISP TIS (BLADE) ×1 IMPLANT
BLADE SURG 15 STRL SS (BLADE) ×2
CANISTER SUC SOCK COL 7IN (MISCELLANEOUS) ×3 IMPLANT
CANISTER SUCT 1200ML W/VALVE (MISCELLANEOUS) ×3 IMPLANT
CHLORAPREP W/TINT 26ML (MISCELLANEOUS) ×3 IMPLANT
CLIP APPLIE 9.375 MED OPEN (MISCELLANEOUS) IMPLANT
CLIP TI WIDE RED SMALL 6 (CLIP) ×3 IMPLANT
COVER MAYO STAND STRL (DRAPES) ×3 IMPLANT
COVER PROBE W GEL 5X96 (DRAPES) ×3 IMPLANT
COVER TABLE BACK 60X90 (DRAPES) ×3 IMPLANT
DECANTER SPIKE VIAL GLASS SM (MISCELLANEOUS) ×3 IMPLANT
DERMABOND ADVANCED (GAUZE/BANDAGES/DRESSINGS) ×2
DERMABOND ADVANCED .7 DNX12 (GAUZE/BANDAGES/DRESSINGS) ×1 IMPLANT
DEVICE DUBIN W/COMP PLATE 8390 (MISCELLANEOUS) ×3 IMPLANT
DRAPE LAPAROSCOPIC ABDOMINAL (DRAPES) ×3 IMPLANT
DRAPE UTILITY XL STRL (DRAPES) ×3 IMPLANT
ELECT COATED BLADE 2.86 ST (ELECTRODE) ×3 IMPLANT
ELECT REM PT RETURN 9FT ADLT (ELECTROSURGICAL) ×3
ELECTRODE REM PT RTRN 9FT ADLT (ELECTROSURGICAL) ×1 IMPLANT
GLOVE BIO SURGEON STRL SZ7.5 (GLOVE) ×3 IMPLANT
GLOVE BIOGEL M STRL SZ7.5 (GLOVE) ×3 IMPLANT
GLOVE BIOGEL PI IND STRL 8 (GLOVE) ×1 IMPLANT
GLOVE BIOGEL PI INDICATOR 8 (GLOVE) ×2
GOWN STRL REUS W/ TWL LRG LVL3 (GOWN DISPOSABLE) ×2 IMPLANT
GOWN STRL REUS W/ TWL XL LVL3 (GOWN DISPOSABLE) ×1 IMPLANT
GOWN STRL REUS W/TWL LRG LVL3 (GOWN DISPOSABLE) ×4
GOWN STRL REUS W/TWL XL LVL3 (GOWN DISPOSABLE) ×2
KIT MARKER MARGIN INK (KITS) ×3 IMPLANT
NEEDLE HYPO 25X1 1.5 SAFETY (NEEDLE) ×3 IMPLANT
NS IRRIG 1000ML POUR BTL (IV SOLUTION) ×3 IMPLANT
PACK BASIN DAY SURGERY FS (CUSTOM PROCEDURE TRAY) ×3 IMPLANT
PENCIL BUTTON HOLSTER BLD 10FT (ELECTRODE) ×3 IMPLANT
SLEEVE SCD COMPRESS KNEE MED (MISCELLANEOUS) ×3 IMPLANT
SPONGE LAP 18X18 X RAY DECT (DISPOSABLE) ×3 IMPLANT
SUT MON AB 4-0 PC3 18 (SUTURE) ×3 IMPLANT
SUT SILK 2 0 SH (SUTURE) ×3 IMPLANT
SUT VICRYL 3-0 CR8 SH (SUTURE) ×3 IMPLANT
SYR CONTROL 10ML LL (SYRINGE) ×3 IMPLANT
TOWEL OR 17X24 6PK STRL BLUE (TOWEL DISPOSABLE) ×3 IMPLANT
TOWEL OR NON WOVEN STRL DISP B (DISPOSABLE) ×3 IMPLANT
TUBE CONNECTING 20'X1/4 (TUBING) ×1
TUBE CONNECTING 20X1/4 (TUBING) ×2 IMPLANT
YANKAUER SUCT BULB TIP NO VENT (SUCTIONS) ×3 IMPLANT

## 2014-08-04 NOTE — Op Note (Signed)
08/04/2014  2:53 PM  PATIENT:  Brittany Gillespie  38 y.o. female  PRE-OPERATIVE DIAGNOSIS:  Left breast abnormal calcs  POST-OPERATIVE DIAGNOSIS:  Left breast abnormal calcs  PROCEDURE:  Procedure(s): RADIOACTIVE SEED AND WIRE LOCALIZATION LUMPECTOMY LEFT BREAST (Left)  SURGEON:  Surgeon(s) and Role:    * Jovita Kussmaul, MD - Primary  PHYSICIAN ASSISTANT:   ASSISTANTS: none   ANESTHESIA:   general  EBL:  Total I/O In: 1400 [I.V.:1400] Out: -   BLOOD ADMINISTERED:none  DRAINS: none   LOCAL MEDICATIONS USED:  MARCAINE     SPECIMEN:  Source of Specimen:  left breast tissue  DISPOSITION OF SPECIMEN:  PATHOLOGY  COUNTS:  YES  TOURNIQUET:  * No tourniquets in log *  DICTATION: .Dragon Dictation After informed consent was obtained the patient was brought to the operating room and placed in the supine position on the operating room table. After adequate induction of general anesthesia the patient's left breast was prepped with ChloraPrep, dried, and draped in usual sterile manner. Previously a radioactive seed had been placed for an area of abnormal calcification in the upper outer left breast. Unfortunately this seed was approximately a 4-5 cm beyond the area of calcification. Because of this a wire was used to localize the area of abnormal calcification. This was placed by radiology this morning. At this point a small curvilinear incision was made in the upper outer left breast with a 15 blade knife. This incision was carried through the skin and subcutaneous tissue sharply with the electrocautery. Once into the breast tissue the path of the wire could be palpated. A small circular portion of breast tissue was excised sharply with the electrocautery around the path of the wire. Once the specimen was removed it was oriented with a short stitch on the superior surface, a long stitch on the anterior surface, and the wire was entering the specimen laterally. A specimen radiograph showed the  calcifications to be within the specimen. The specimen was then sent to pathology for further evaluation. The location of the seed was determined using the neoprobe set on the iodine 125 setting. The neoprobe was used to help directly dissect through the breast tissue until the area of the seed was located and removed. This was also done sharply with the electrocautery. Once the area with the seed was removed it was placed in a specimen container and a specimen radiograph was obtained confirming that this seed was removed from the patient. There was no further radioactivity in the patient's breast. The specimen with the radioactive seed was sent separately to pathology. Hemostasis was achieved using the Bovie electrocautery. The wound was infiltrated with quarter percent Marcaine and irrigated with saline. The deep layer the wound was closed with interrupted 3-0 Vicryl stitches. The skin was then closed with interrupted 4-0 Monocryl subcuticular stitches. Dermabond dressings were applied. The patient tolerated the procedure well. At the end of the case all needle sponge and instrument counts were correct. The patient was then awakened and taken to recovery in stable condition.  PLAN OF CARE: Discharge to home after PACU  PATIENT DISPOSITION:  PACU - hemodynamically stable.   Delay start of Pharmacological VTE agent (>24hrs) due to surgical blood loss or risk of bleeding: not applicable

## 2014-08-04 NOTE — Transfer of Care (Signed)
Immediate Anesthesia Transfer of Care Note  Patient: Brittany Gillespie  Procedure(s) Performed: Procedure(s): RADIOACTIVE SEED AND WIRE LOCALIZATION LUMPECTOMY LEFT BREAST (Left)  Patient Location: PACU  Anesthesia Type:General  Level of Consciousness: awake, alert , oriented and patient cooperative  Airway & Oxygen Therapy: Patient Spontanous Breathing and Patient connected to face mask oxygen  Post-op Assessment: Report given to PACU RN, Post -op Vital signs reviewed and stable and Patient moving all extremities  Post vital signs: Reviewed and stable  Complications: No apparent anesthesia complications

## 2014-08-04 NOTE — Interval H&P Note (Signed)
History and Physical Interval Note:  08/04/2014 1:29 PM  Brittany Gillespie  has presented today for surgery, with the diagnosis of left breast abnormal calcs  The various methods of treatment have been discussed with the patient and family. After consideration of risks, benefits and other options for treatment, the patient has consented to  Procedure(s):  RADIOACTIVE SEED LOCALIZATION LUMPECTOMY LEFT BREAST (Left) as a surgical intervention .  The patient's history has been reviewed, patient examined, no change in status, stable for surgery.  I have reviewed the patient's chart and labs.  Questions were answered to the patient's satisfaction.     TOTH III,Rufino Staup S

## 2014-08-04 NOTE — Telephone Encounter (Signed)
Last AEX: 07/05/14 Last refill: 05/31/14 #3 packs Current AEX:07/13/15  Pt will be getting an IUD per telephone enounter 07/06/14.   Please advise

## 2014-08-04 NOTE — Anesthesia Procedure Notes (Signed)
Procedure Name: LMA Insertion Date/Time: 08/04/2014 1:50 PM Performed by: Baxter Flattery Pre-anesthesia Checklist: Patient identified, Emergency Drugs available, Suction available and Patient being monitored Patient Re-evaluated:Patient Re-evaluated prior to inductionOxygen Delivery Method: Circle System Utilized Preoxygenation: Pre-oxygenation with 100% oxygen Intubation Type: IV induction Ventilation: Mask ventilation without difficulty LMA: LMA inserted LMA Size: 4.0 Number of attempts: 1 Airway Equipment and Method: bite block Placement Confirmation: positive ETCO2 and breath sounds checked- equal and bilateral Tube secured with: Tape Dental Injury: Teeth and Oropharynx as per pre-operative assessment

## 2014-08-04 NOTE — Anesthesia Preprocedure Evaluation (Signed)
Anesthesia Evaluation  Patient identified by MRN, date of birth, ID band Patient awake    Reviewed: Allergy & Precautions, H&P , NPO status , Patient's Chart, lab work & pertinent test results  History of Anesthesia Complications (+) PONV  Airway Mallampati: II  Neck ROM: full    Dental   Pulmonary former smoker,          Cardiovascular negative cardio ROS      Neuro/Psych    GI/Hepatic   Endo/Other    Renal/GU      Musculoskeletal   Abdominal   Peds  Hematology   Anesthesia Other Findings   Reproductive/Obstetrics                           Anesthesia Physical Anesthesia Plan  ASA: II  Anesthesia Plan: General   Post-op Pain Management:    Induction: Intravenous  Airway Management Planned: LMA  Additional Equipment:   Intra-op Plan:   Post-operative Plan:   Informed Consent: I have reviewed the patients History and Physical, chart, labs and discussed the procedure including the risks, benefits and alternatives for the proposed anesthesia with the patient or authorized representative who has indicated his/her understanding and acceptance.     Plan Discussed with: CRNA, Anesthesiologist and Surgeon  Anesthesia Plan Comments:         Anesthesia Quick Evaluation

## 2014-08-04 NOTE — Telephone Encounter (Signed)
Pt calling for a refill on her birth control Nora-B to [facility] at [phone].

## 2014-08-04 NOTE — Anesthesia Postprocedure Evaluation (Signed)
Anesthesia Post Note  Patient: Brittany Gillespie  Procedure(s) Performed: Procedure(s) (LRB): RADIOACTIVE SEED AND WIRE LOCALIZATION LUMPECTOMY LEFT BREAST (Left)  Anesthesia type: General  Patient location: PACU  Post pain: Pain level controlled and Adequate analgesia  Post assessment: Post-op Vital signs reviewed, Patient's Cardiovascular Status Stable, Respiratory Function Stable, Patent Airway and Pain level controlled  Last Vitals:  Filed Vitals:   08/04/14 1615  BP: 131/69  Pulse: 83  Temp:   Resp: 15    Post vital signs: Reviewed and stable  Level of consciousness: awake, alert  and oriented  Complications: No apparent anesthesia complications

## 2014-08-04 NOTE — H&P (View-Only) (Signed)
Patient ID: Brittany Gillespie, female   DOB: August 05, 1976, 38 y.o.   MRN: [id number]  Chief Complaint  Patient presents with  . eval left breast    HPI Brittany Gillespie is a 38 y.o. female.  We are asked to see the patient in consultation by Dr. Derrel Nip to evaluate her for calcifications in her upper outer left breast. The patient is a 38 year old white female who recently complained to her medical doctor of some pain in the left breast. The pain was described as an unusual 8. It as an intermittent pain that comes and goes. She expresses the pain maybe once every week or 2. She has not had any discharge from her nipple. She was evaluated with mammogram that showed some abnormal 1 cm area of calcifications in the upper outer left breast. Unfortunately these were too deep to be biopsied by stereotactic guidance. She is going to require an open biopsy of this area.  HPI  Past Medical History  Diagnosis Date  . Allergy   . Anxiety   . Depression   . Dysmenorrhea     Past Surgical History  Procedure Laterality Date  . Anterior cruciate ligament repair  2001    Rt knee    Family History  Problem Relation Age of Onset  . Alzheimer's disease Maternal Grandmother   . Cancer Maternal Grandmother     breast cancer  . Stroke Maternal Grandfather   . Heart disease Maternal Grandfather   . Glaucoma Paternal Grandmother   . Diabetes Paternal Grandfather     Social History History  Substance Use Topics  . Smoking status: Former Smoker    Quit date: 11/27/2011  . Smokeless tobacco: Not on file  . Alcohol Use: 4.0 oz/week    8 drink(s) per week     Comment: 8 glasses of wine or beers per week    No Known Allergies  Current Outpatient Prescriptions  Medication Sig Dispense Refill  . ibuprofen (ADVIL,MOTRIN) 200 MG tablet Take 200 mg by mouth every 6 (six) hours as needed for pain.      Marland Kitchen loratadine (CLARITIN) 10 MG tablet Take 10 mg by mouth daily.      . norethindrone (MICRONOR,CAMILA,ERRIN)  0.35 MG tablet Take 1 tablet (0.35 mg total) by mouth daily.  3 Package  0   No current facility-administered medications for this visit.    Review of Systems Review of Systems  Constitutional: Negative.   HENT: Negative.   Eyes: Negative.   Respiratory: Negative.   Cardiovascular: Negative.   Gastrointestinal: Negative.   Endocrine: Negative.   Genitourinary: Negative.   Musculoskeletal: Negative.   Skin: Negative.   Allergic/Immunologic: Negative.   Neurological: Negative.   Hematological: Negative.   Psychiatric/Behavioral: Negative.     Blood pressure 118/80, pulse 76, temperature 98.2 F (36.8 C), height 5' 6"  (1.676 m), weight 158 lb (71.668 kg).  Physical Exam Physical Exam  Constitutional: She is oriented to person, place, and time. She appears well-developed and well-nourished.  HENT:  Head: Normocephalic and atraumatic.  Eyes: Conjunctivae and EOM are normal. Pupils are equal, round, and reactive to light.  Neck: Normal range of motion. Neck supple.  Cardiovascular: Normal rate, regular rhythm and normal heart sounds.   Pulmonary/Chest: Effort normal and breath sounds normal.  There is no palpable mass in either breast. There is one small subcentimeter mobile palpable lymph node in the left axilla  Abdominal: Soft. Bowel sounds are normal.  Musculoskeletal: Normal range of motion.  Lymphadenopathy:  She has no cervical adenopathy.  Neurological: She is alert and oriented to person, place, and time.  Skin: Skin is warm and dry.  Psychiatric: She has a normal mood and affect. Her behavior is normal.    Data Reviewed As above  Assessment    The patient appears to have a small area of abnormal calcifications in the upper outer left breast that were not amenable to stereotactic biopsy. Because of this she will require an open biopsy of this area. I think she would be a good candidate for a radioactive seed localized lumpectomy. I have discussed with her in  detail the risks and benefits of the operation to do this as well as some of the technical aspects and she understands and wishes to proceed     Plan    Plan for left breast radioactive seed localized lumpectomy        TOTH III,PAUL S 07/22/2014, 3:31 PM

## 2014-08-04 NOTE — Discharge Instructions (Signed)

## 2014-08-05 ENCOUNTER — Encounter (HOSPITAL_BASED_OUTPATIENT_CLINIC_OR_DEPARTMENT_OTHER): Payer: Self-pay | Admitting: General Surgery

## 2014-08-05 NOTE — Telephone Encounter (Signed)
Lmom to contact office

## 2014-08-06 ENCOUNTER — Other Ambulatory Visit: Payer: Self-pay

## 2014-08-06 MED ORDER — NORETHINDRONE 0.35 MG PO TABS: 1.0000 | ORAL_TABLET | Freq: Every day | ORAL | Status: DC

## 2014-08-06 NOTE — Telephone Encounter (Signed)
S/W pt and she stated her last pill is tomorrow. She has bad periods and would really like a refill.  She stated she would get the results today or Monday. Results are not in epic yet.

## 2014-08-06 NOTE — Telephone Encounter (Deleted)
Pt called and stated her last pill is tomorrow. She was told that she should get the result today or Monday.  Brittany Gillespie states her periods are really bad and hopes she can get a refill.

## 2014-09-02 ENCOUNTER — Other Ambulatory Visit: Payer: Self-pay | Admitting: Obstetrics and Gynecology

## 2014-09-02 NOTE — Telephone Encounter (Signed)
Last AEX: 07/05/14 Last refill: 08/06/14 1 pack X 0 Current AEX:07/13/15  Please advise

## 2014-09-20 ENCOUNTER — Telehealth: Payer: Self-pay | Admitting: Obstetrics and Gynecology

## 2014-09-20 NOTE — Telephone Encounter (Signed)
Patient calling to schedule Mirena.

## 2014-09-21 NOTE — Telephone Encounter (Signed)
If we have to choose one IUD, I would choose Skyla.  The Skyla does not control cycles as well as the Mirena.  It does have less hormone in it.  The Mirena could be too big.   I am unfamiliar with ordering and sending it to the patient's home.  Should it be sent to our office with her name on it?  OK to place IUD at any time, but patient will need to do a serum Beta HCG two days prior to placement and then abstain until after placement.   I do recommend:  Cytotec 200 mcg orally the night prior to insertion and Cytotec 200 mcg orally the morning of placement  AND a paracervical block at the time of the insertion.

## 2014-09-21 NOTE — Telephone Encounter (Addendum)
Dr. Quincy Simmonds,  In order for IUD to be covered, patient has to have IUD mail ordered to her and bring with her to appointment. We found this out with insurance pre-certification back in August. I wanted to confirm order with you before I placed it. Okay to order Mirena IUD? The IUD order is for Mirena or Isla Pence based on uterine size day of placement.   Also, patient is on continuous ocp and says she has not had a cycle in at least 2 years. Can she have IUD placement at any time?  Nurse Notes: Will need order for Cytotec sent to [place] sent to mail order pharmacy future scripts.

## 2014-09-22 ENCOUNTER — Encounter: Payer: Self-pay | Admitting: Obstetrics and Gynecology

## 2014-09-23 NOTE — Telephone Encounter (Signed)
Message left to return call to [place] at [phone].   Will give message from Dr. Quincy Simmonds and schedule appointment with orders.

## 2014-09-27 NOTE — Telephone Encounter (Signed)
Ok for [employer] 1/20 Fe.  Generic OK.  This is a low dose pill.  Please place order but ask patient if she has another preference.  Patient needs to be aware of warning signs of thrombotic events:  Headache, Sudden shortness of breath, Chest pain, Lower extremity edema and swelling. I would like to see her for a recheck in 3 months to be sure this is a good fit for her.  Thank you!

## 2014-09-27 NOTE — Telephone Encounter (Signed)
Dr. Quincy Simmonds,  Patient returned call and now would like to try ocp prior to Mirena.  Patient sent detailed mychart message.  Please advise.

## 2014-09-29 MED ORDER — DROSPIRENONE-ETHINYL ESTRADIOL 3-0.02 MG PO TABS: 1.0000 | ORAL_TABLET | Freq: Every day | ORAL | Status: DC

## 2014-09-29 NOTE — Telephone Encounter (Signed)
Spoke with Dr. Quincy Simmonds, okay to order continuous Yaz.  Order placed at patient pharmacy of choice for 3 months with no refills. Advised can skip placebo pills and take active pills for 3 months then have one cycle and restart.  Patient given warning signs as instructed below. Patient verbalized understanding of warning signs and agreeable to follow up.  Scheduled for 01/17/14 at 2:45.  Patient has high deductible plan, wondering how Dr. Quincy Simmonds will code visit.  Patient states she has coverage for visits for contraceptive methods and counseling. Advised can discuss with Dr. Quincy Simmonds at time of visit, but would send in this message. Patient agreeable. Will follow up as scheduled or call back with any concerns.  Dr. Quincy Simmonds can you review order and I will close encounter?

## 2014-09-29 NOTE — Telephone Encounter (Signed)
Order for [facility] signed and encounter closed.

## 2014-12-15 ENCOUNTER — Other Ambulatory Visit: Payer: Self-pay | Admitting: Obstetrics and Gynecology

## 2014-12-15 NOTE — Telephone Encounter (Signed)
Medication refill request: Yaz Last AEX:  07/05/14 Next AEX: 07/13/15 Last MMG (if hormonal medication request): MMG Diagnostic Bil 07/09/14 BIRADS4:Suspicious. Specimen radiograph of Left Breast Benign breast tissue.  Refill authorized: 09/29/14 #3pack/0R. Today ? Patient taking NORA-BE?

## 2015-01-17 ENCOUNTER — Encounter: Payer: Self-pay | Admitting: Obstetrics and Gynecology

## 2015-01-17 ENCOUNTER — Ambulatory Visit (INDEPENDENT_AMBULATORY_CARE_PROVIDER_SITE_OTHER): Payer: BLUE CROSS/BLUE SHIELD | Admitting: Obstetrics and Gynecology

## 2015-01-17 VITALS — BP 124/80 | HR 66 | Ht 65.0 in | Wt 166.0 lb

## 2015-01-17 DIAGNOSIS — Z309 Encounter for contraceptive management, unspecified: Secondary | ICD-10-CM

## 2015-01-17 NOTE — Patient Instructions (Signed)
We will plan to insert your IUD during your menstrual cycle.  We will call in the Cytotec to your pharmacy once we have the insertion of the IUD confirmed.

## 2015-01-17 NOTE — Progress Notes (Signed)
Patient ID: Brittany Gillespie, female   DOB: 1976/01/31, 39 y.o.   MRN: [id number] GYNECOLOGY  VISIT   HPI: 39 y.o.   Married  Caucasian  female   G0P0 with Patient's last menstrual period was 12/17/2014 (approximate).   here for 3 month follow up since beginning OCPs.  Has used oral contraceptives for many, many years.  Used combined oral contraceptives but needed to switch to progesterone only OCPs when she reached age 53 due to tobacco use.    Took Micronor and liked not having menses but developed cystic acne, decreased libido, and vaginal dryness.   She quit tobacco 3 years ago.  Had left breast biopsy 08/04/14 - benign fibrocystic change and calcifications.  In November started a combined oral contraceptive again, [place].   Menses only every 3 months, light and no cramps.   No vaginal dryness.    Headaches which are occasional and dull.   Some  mood swings and lethergy.  Wants birth control for contraception.  Declines childbearing.  She and her husband enjoy traveling.  Likes to not have a cycle.   GYNECOLOGIC HISTORY: Patient's last menstrual period was 12/17/2014 (approximate). Contraception:   OCPs--Vestura Menopausal hormone therapy: n/a        OB History    Gravida Para Term Preterm AB TAB SAB Ectopic Multiple Living   0                  Patient Active Problem List   Diagnosis Date Noted  . Breast calcification, left 07/22/2014    Past Medical History  Diagnosis Date  . Breast calcification, left 06/2014  . PONV (postoperative nausea and vomiting)     Past Surgical History  Procedure Laterality Date  . Anterior cruciate ligament repair Right 2003  . Breast lumpectomy with radioactive seed localization Left 08/04/2014    Procedure: RADIOACTIVE SEED AND WIRE LOCALIZATION LUMPECTOMY LEFT BREAST;  Surgeon: Autumn Messing III, MD;  Location: [place];  Service: General;  Laterality: Left;    Current Outpatient Prescriptions  Medication Sig Dispense  Refill  . VESTURA 3-0.02 MG tablet TAKE 1 TABLET BY MOUTH DAILY 84 tablet 2   No current facility-administered medications for this visit.     ALLERGIES: Review of patient's allergies indicates no known allergies.  Family History  Problem Relation Age of Onset  . Alzheimer's disease Maternal Grandmother   . Cancer Maternal Grandmother     breast cancer  . Stroke Maternal Grandfather   . Heart disease Maternal Grandfather   . Glaucoma Paternal Grandmother   . Diabetes Paternal Grandfather     History   Social History  . Marital Status: Married    Spouse Name: N/A  . Number of Children: N/A  . Years of Education: N/A   Occupational History  . Sales    Social History Main Topics  . Smoking status: Former Smoker    Quit date: 11/27/2011  . Smokeless tobacco: Never Used  . Alcohol Use: 0.0 oz/week    0 Standard drinks or equivalent per week     Comment: 1 beer daily  . Drug Use: Yes    Special: Marijuana     Comment: last used 1 week ago  . Sexual Activity:    Partners: Male    Birth Control/ Protection: Pill     Comment: Vestura--takes continuously   Other Topics Concern  . Not on file   Social History Narrative    ROS:  Pertinent items are noted  in HPI.  PHYSICAL EXAMINATION:    BP 124/80 mmHg  Pulse 66  Ht 5' 5"  (1.651 m)  Wt 166 lb (75.297 kg)  BMI 27.62 kg/m2  LMP 12/17/2014 (Approximate)      ASSESSMENT  Encounter for contraceptive counseling.  Need for reliable contraception.  Declines childbearing.  Desire for amenorrhea.  Recent benign left breast biopsy.   PLAN  Discussion of risks and benefits of OCPs - progesterone and combined, Nuva Ring, OrthoEvra, and IUDs. Patient would like to proceed forward with a Mirena IUD.  Will precert.  I will plan to do Cytotec 200 mg the evening prior to and the morning of the insertion.  I will also plan for a paracervical block. Return hopefully with next cycle for IUD insertion.    An After Visit  Summary was printed and given to the patient.  _15_____ minutes face to face time of which over 50% was spent in counseling.

## 2015-01-24 ENCOUNTER — Telehealth: Payer: Self-pay | Admitting: Obstetrics and Gynecology

## 2015-01-24 NOTE — Telephone Encounter (Signed)
Left message for patient to call back. Need to go over contraception benefits.

## 2015-01-25 NOTE — Telephone Encounter (Signed)
Returning a call to [place].

## 2015-01-25 NOTE — Telephone Encounter (Signed)
Spoke with patient. Advised that per benefit quote received, IUD's implants, and other  contraceptive devices are excluded under the patients plan. She will need to use oral contraception only to be covered.  Patient agreeable.   Would like birth control prescription refilled.  Call to [facility]

## 2015-01-25 NOTE — Telephone Encounter (Signed)
Spoke with patient. Patient was seen for annual on 07/05/2014 and 3 month follow up on OCP on 01/17/2015. Patient states that she would like to continue with the current OCP she is on [place]. Advised patient would provide update to Dr.Silva with request for continuation on Vestura. Patient requests rx [facility] on [facility].   Dr.Silva okay to send refills of Vestura until next aex in for patient?

## 2015-01-26 ENCOUNTER — Other Ambulatory Visit: Payer: Self-pay | Admitting: Obstetrics and Gynecology

## 2015-01-26 MED ORDER — DROSPIRENONE-ETHINYL ESTRADIOL 3-0.02 MG PO TABS: 1.0000 | ORAL_TABLET | Freq: Every day | ORAL | Status: DC

## 2015-01-26 NOTE — Telephone Encounter (Signed)
Spoke with patient. Advised ocp has been sent with refills until next annual. Patient is agreeable.  Routing to provider for final review. Patient agreeable to disposition. Will close encounter

## 2015-01-26 NOTE — Telephone Encounter (Signed)
Please inform patient that I have sent a one year Rx for her OCPs to her pharmacy.

## 2015-01-26 NOTE — Telephone Encounter (Signed)
Left message to call Hollie Wojahn at [phone]. 

## 2015-01-28 ENCOUNTER — Telehealth: Payer: Self-pay | Admitting: Obstetrics and Gynecology

## 2015-01-28 NOTE — Telephone Encounter (Signed)
Patient will continue birth control pill. IUD device is not covered by health or rx plan.

## 2015-02-24 ENCOUNTER — Other Ambulatory Visit: Payer: Self-pay | Admitting: Obstetrics and Gynecology

## 2015-02-24 ENCOUNTER — Encounter: Payer: Self-pay | Admitting: Obstetrics and Gynecology

## 2015-02-24 MED ORDER — NORETHIN-ETH ESTRAD-FE BIPHAS 1 MG-10 MCG / 10 MCG PO TABS: 1.0000 | ORAL_TABLET | Freq: Every day | ORAL | Status: DC

## 2015-05-06 ENCOUNTER — Ambulatory Visit (INDEPENDENT_AMBULATORY_CARE_PROVIDER_SITE_OTHER): Payer: BLUE CROSS/BLUE SHIELD | Admitting: Family Medicine

## 2015-05-06 VITALS — BP 122/78 | HR 86 | Temp 98.3°F | Resp 18 | Ht 65.0 in | Wt 165.0 lb

## 2015-05-06 DIAGNOSIS — G4700 Insomnia, unspecified: Secondary | ICD-10-CM

## 2015-05-06 MED ORDER — CITALOPRAM HYDROBROMIDE 20 MG PO TABS: 20.0000 mg | ORAL_TABLET | Freq: Every day | ORAL | Status: DC

## 2015-05-06 MED ORDER — ZOLPIDEM TARTRATE 5 MG PO TABS: 5.0000 mg | ORAL_TABLET | Freq: Every evening | ORAL | Status: DC | PRN

## 2015-05-06 NOTE — Progress Notes (Signed)
Patient ID: Brittany Gillespie, female   DOB: [birth year], 38 y.o.   MRN: [id number]   This chart was scribed for Robyn Haber, MD by [facility], medical scribe at [facility].The patient was seen in exam room 08 and the patient's care was started at 2:50 PM.  Patient ID: Brittany Gillespie MRN: [id number], DOB: 1976/11/24, 39 y.o. Date of Encounter: 05/06/2015  Primary Physician: Arloa Koh, MD  Chief Complaint:  Chief Complaint  Patient presents with   Insomnia    x1 week    Anxiety   HPI:  Brittany Gillespie is a 39 y.o. female who presents to [facility] complaining of anxiety and insomnia.  Anxiety, Insomnia : Ongoing for three months, but worse this past week since she has not slept for seven days. She has had insomnia for several years and takes OTC medications. Pt has taken Lexapro and Ambien in the past for depression and insomnia, she did tolerate these drugs in the past.  She usually falls asleep within 30 min to an hour but wakes up at 2:00 AM and cannot fall back asleep. Pt believes she worries herself because she cannot sleep and she feels like she is not doing well at work despite just getting a raise. She is still exercising, and eating well. Pt is married but has no kids. Her home life is great. Works for a  [employer], she says work life is great. Pt is going to [facility] for vacation next week.  Past Medical History  Diagnosis Date   Breast calcification, left 06/2014   PONV (postoperative nausea and vomiting)    Allergy    Anxiety    Depression     Home Meds: Prior to Admission medications   Medication Sig Start Date End Date Taking? Authorizing Provider  Norethindrone-Ethinyl Estradiol-Fe Biphas (LO LOESTRIN FE) 1 MG-10 MCG / 10 MCG tablet Take 1 tablet by mouth daily. 02/24/15  Yes Brook Oletta Lamas, MD    Allergies: No Known Allergies  History   Social History   Marital Status: Married   Spouse Name: N/A   Number of Children: N/A   Years of Education: N/A   Occupational History   Sales    Social History Main Topics   Smoking status: Former Smoker    Quit date: 11/27/2011   Smokeless tobacco: Never Used   Alcohol Use: 0.0 oz/week    0 Standard drinks or equivalent per week     Comment: 1 beer daily   Drug Use: Yes    Special: Marijuana     Comment: last used 1 week ago   Sexual Activity:    Partners: Male    Patent examiner Protection: Pill     Comment: Vestura--takes continuously   Other Topics Concern   Not on file   Social History Narrative     Review of Systems: Constitutional: negative for chills, fever, night sweats, weight changes, or fatigue  HEENT: negative for vision changes, hearing loss, congestion, rhinorrhea, ST, epistaxis, or sinus pressure Cardiovascular: negative for chest pain or palpitations Respiratory: negative for hemoptysis, wheezing, shortness of breath, or cough Abdominal: negative for abdominal pain, nausea, vomiting, diarrhea, or constipation Dermatological: negative for rash Neurologic: negative for headache, dizziness, or syncope Psych: Positive for anxiety and insomnia  All other systems reviewed and are otherwise negative with the exception to those above and in the HPI.  Physical Exam: Blood pressure 122/78, pulse 86, temperature  98.3 F (36.8 C), temperature source Oral, resp. rate 18, height 5' 5"  (1.651 m), weight 165 lb (74.844 kg), SpO2 99 %., Body mass index is 27.46 kg/(m^2). General: Well developed, well nourished, in no acute distress. Pt is tearful. Head: Normocephalic, atraumatic, eyes without discharge, sclera non-icteric, nares are without discharge. Bilateral auditory canals clear, TM's are without perforation, pearly grey and translucent with reflective cone of light bilaterally. Oral cavity moist, posterior pharynx without exudate, erythema, peritonsillar abscess, or post nasal drip.  Neck: Supple. No  thyromegaly. Full ROM. No lymphadenopathy. Msk:  Strength and tone normal for age. Extremities/Skin: Warm and dry. No clubbing or cyanosis. No edema. No rashes or suspicious lesions. Neuro: Alert and oriented X 3. Moves all extremities spontaneously. Gait is normal. CNII-XII grossly in tact. Psych:  Responds to questions appropriately with a normal affect.     ASSESSMENT AND PLAN:  40 y.o. year old female with   This chart was scribed in my presence and reviewed by me personally.    ICD-9-CM ICD-10-CM   1. Insomnia 780.52 G47.00 citalopram (CELEXA) 20 MG tablet     zolpidem (AMBIEN) 5 MG tablet     Signed, Robyn Haber, MD   No diagnosis found.  Signed, Robyn Haber, MD 05/06/2015 2:50 PM

## 2015-05-06 NOTE — Patient Instructions (Signed)
Insomnia Insomnia is frequent trouble falling and/or staying asleep. Insomnia can be a long term problem or a short term problem. Both are common. Insomnia can be a short term problem when the wakefulness is related to a certain stress or worry. Long term insomnia is often related to ongoing stress during waking hours and/or poor sleeping habits. Overtime, sleep deprivation itself can make the problem worse. Every little thing feels more severe because you are overtired and your ability to cope is decreased. CAUSES   Stress, anxiety, and depression.  Poor sleeping habits.  Distractions such as TV in the bedroom.  Naps close to bedtime.  Engaging in emotionally charged conversations before bed.  Technical reading before sleep.  Alcohol and other sedatives. They may make the problem worse. They can hurt normal sleep patterns and normal dream activity.  Stimulants such as caffeine for several hours prior to bedtime.  Pain syndromes and shortness of breath can cause insomnia.  Exercise late at night.  Changing time zones may cause sleeping problems (jet lag). It is sometimes helpful to have someone observe your sleeping patterns. They should look for periods of not breathing during the night (sleep apnea). They should also look to see how long those periods last. If you live alone or observers are uncertain, you can also be observed at a sleep clinic where your sleep patterns will be professionally monitored. Sleep apnea requires a checkup and treatment. Give your caregivers your medical history. Give your caregivers observations your family has made about your sleep.  SYMPTOMS   Not feeling rested in the morning.  Anxiety and restlessness at bedtime.  Difficulty falling and staying asleep. TREATMENT   Your caregiver may prescribe treatment for an underlying medical disorders. Your caregiver can give advice or help if you are using alcohol or other drugs for self-medication. Treatment  of underlying problems will usually eliminate insomnia problems.  Medications can be prescribed for short time use. They are generally not recommended for lengthy use.  Over-the-counter sleep medicines are not recommended for lengthy use. They can be habit forming.  You can promote easier sleeping by making lifestyle changes such as:  Using relaxation techniques that help with breathing and reduce muscle tension.  Exercising earlier in the day.  Changing your diet and the time of your last meal. No night time snacks.  Establish a regular time to go to bed.  Counseling can help with stressful problems and worry.  Soothing music and white noise may be helpful if there are background noises you cannot remove.  Stop tedious detailed work at least one hour before bedtime. HOME CARE INSTRUCTIONS   Keep a diary. Inform your caregiver about your progress. This includes any medication side effects. See your caregiver regularly. Take note of:  Times when you are asleep.  Times when you are awake during the night.  The quality of your sleep.  How you feel the next day. This information will help your caregiver care for you.  Get out of bed if you are still awake after 15 minutes. Read or do some quiet activity. Keep the lights down. Wait until you feel sleepy and go back to bed.  Keep regular sleeping and waking hours. Avoid naps.  Exercise regularly.  Avoid distractions at bedtime. Distractions include watching television or engaging in any intense or detailed activity like attempting to balance the household checkbook.  Develop a bedtime ritual. Keep a familiar routine of bathing, brushing your teeth, climbing into bed at the same   time each night, listening to soothing music. Routines increase the success of falling to sleep faster.  Use relaxation techniques. This can be using breathing and muscle tension release routines. It can also include visualizing peaceful scenes. You can  also help control troubling or intruding thoughts by keeping your mind occupied with boring or repetitive thoughts like the old concept of counting sheep. You can make it more creative like imagining planting one beautiful flower after another in your backyard garden.  During your day, work to eliminate stress. When this is not possible use some of the previous suggestions to help reduce the anxiety that accompanies stressful situations. MAKE SURE YOU:   Understand these instructions.  Will watch your condition.  Will get help right away if you are not doing well or get worse. Document Released: 11/09/2000 Document Revised: 02/04/2012 Document Reviewed: 12/10/2007 ExitCare Patient Information 2015 [employer]. This information is not intended to replace advice given to you by your health care provider. Make sure you discuss any questions you have with your health care provider.  

## 2015-05-31 ENCOUNTER — Ambulatory Visit (INDEPENDENT_AMBULATORY_CARE_PROVIDER_SITE_OTHER): Payer: BLUE CROSS/BLUE SHIELD | Admitting: Family Medicine

## 2015-05-31 VITALS — BP 112/70 | HR 69 | Temp 98.2°F | Resp 16 | Ht 65.0 in | Wt 163.0 lb

## 2015-05-31 DIAGNOSIS — R05 Cough: Secondary | ICD-10-CM | POA: Diagnosis not present

## 2015-05-31 DIAGNOSIS — J301 Allergic rhinitis due to pollen: Secondary | ICD-10-CM

## 2015-05-31 DIAGNOSIS — R059 Cough, unspecified: Secondary | ICD-10-CM

## 2015-05-31 MED ORDER — TRIAMCINOLONE ACETONIDE 55 MCG/ACT NA AERO: 2.0000 | INHALATION_SPRAY | Freq: Every day | NASAL | Status: DC

## 2015-05-31 MED ORDER — CEFDINIR 300 MG PO CAPS: 300.0000 mg | ORAL_CAPSULE | Freq: Two times a day (BID) | ORAL | Status: DC

## 2015-05-31 MED ORDER — BENZONATATE 100 MG PO CAPS: 100.0000 mg | ORAL_CAPSULE | Freq: Three times a day (TID) | ORAL | Status: DC | PRN

## 2015-05-31 NOTE — Patient Instructions (Signed)
It looks like you do have allergies Add a daily claritin or zyrtec as needed Try the nasacort spray Tessalon perles as needed for cough, delsym is a good OTC syrup If your symptoms do not improve and you are away on vacation, you can use the omnicef antibiotic.  pleese let me know if you do end up using this remember that antibiotics can interfered with your birth control pill

## 2015-05-31 NOTE — Progress Notes (Signed)
[facility] [address] [place] [address] [phone]  Date:  05/31/2015   Name:  Brittany Gillespie   DOB:  Oct 24, 1976   MRN:  [id number]  PCP:  Arloa Koh, MD    Chief Complaint: Cough   History of Present Illness:  Brittany Gillespie is a 39 y.o. very pleasant female patient who presents with the following:  She has noted a "nagging cough" for a week or so. She thought that it might be allergies.  She is traveling next week and wanted to be sure all is well. The cough is sometimes productive of phlegm She has not noted fever or chills, " I feel pretty good overall."  She has noted sneezing. Runny nose, itchy eyes.   No GI symptoms She tried some OTC benadryl, antihistamines. She has not tried any cough syrup.   She does not generally tend to get allergies.   She is going to [place] next week and is worried about trying to seek care while on vacation Former smoker  Patient Active Problem List   Diagnosis Date Noted  . Breast calcification, left 07/22/2014    Past Medical History  Diagnosis Date  . Breast calcification, left 06/2014  . PONV (postoperative nausea and vomiting)   . Allergy   . Anxiety   . Depression     Past Surgical History  Procedure Laterality Date  . Anterior cruciate ligament repair Right 2003  . Breast lumpectomy with radioactive seed localization Left 08/04/2014    Procedure: RADIOACTIVE SEED AND WIRE LOCALIZATION LUMPECTOMY LEFT BREAST;  Surgeon: Autumn Messing III, MD;  Location: [place];  Service: General;  Laterality: Left;  . Breast surgery      History  Substance Use Topics  . Smoking status: Former Smoker    Quit date: 11/27/2011  . Smokeless tobacco: Never Used  . Alcohol Use: 0.0 oz/week    0 Standard drinks or equivalent per week     Comment: 1 beer daily    Family History  Problem Relation Age of Onset  . Alzheimer's disease Maternal Grandmother   . Cancer Maternal Grandmother     breast  cancer  . Stroke Maternal Grandfather   . Heart disease Maternal Grandfather   . Glaucoma Paternal Grandmother   . Diabetes Paternal Grandfather   . Mental illness Father     No Known Allergies  Medication list has been reviewed and updated.  Current Outpatient Prescriptions on File Prior to Visit  Medication Sig Dispense Refill  . citalopram (CELEXA) 20 MG tablet Take 1 tablet (20 mg total) by mouth daily. 30 tablet 5  . Norethindrone-Ethinyl Estradiol-Fe Biphas (LO LOESTRIN FE) 1 MG-10 MCG / 10 MCG tablet Take 1 tablet by mouth daily. 1 Package 4  . zolpidem (AMBIEN) 5 MG tablet Take 1 tablet (5 mg total) by mouth at bedtime as needed for sleep. 30 tablet 5   No current facility-administered medications on file prior to visit.    Review of Systems:  As per HPI- otherwise negative. She is not aware of any sickness at home or work No sinus pain or pressure  Physical Examination: Filed Vitals:   05/31/15 1206  BP: 112/70  Pulse: 69  Temp: 98.2 F (36.8 C)  Resp: 16   Filed Vitals:   05/31/15 1206  Height: 5' 5"  (1.651 m)  Weight: 163 lb (73.936 kg)   Body mass index is 27.12 kg/(m^2). Ideal Body Weight: Weight in (lb)  to have BMI = 25: 149.9  GEN: WDWN, NAD, Non-toxic, A & O x 3, mild overweight, looks well HEENT: Atraumatic, Normocephalic. Neck supple. No masses, No LAD.  Bilateral TM wnl, oropharynx normal.  PEERL,EOMI.   Stringy nasal mucus c/w allergies Ears and Nose: No external deformity. CV: RRR, No M/G/R. No JVD. No thrill. No extra heart sounds. PULM: CTA B, no wheezes, crackles, rhonchi. No retractions. No resp. distress. No accessory muscle use. EXTR: No c/c/e NEURO Normal gait.  PSYCH: Normally interactive. Conversant. Not depressed or anxious appearing.  Calm demeanor.    Assessment and Plan: Allergic rhinitis due to pollen - Plan: triamcinolone (NASACORT) 55 MCG/ACT AERO nasal inhaler  Cough - Plan: cefdinir (OMNICEF) 300 MG capsule, benzonatate  (TESSALON) 100 MG capsule  Likely allergy sx- will add nasacort, tessalon and claritin or zyrtec to her regimen Did give omnicef rx to hold and use if she does not get better or starts to get worse while on vacation- did ask her to let me know if this case. She will do so  Signed Lamar Blinks, MD

## 2015-07-13 ENCOUNTER — Ambulatory Visit: Payer: BC Managed Care – PPO | Admitting: Obstetrics and Gynecology

## 2015-08-03 ENCOUNTER — Ambulatory Visit (INDEPENDENT_AMBULATORY_CARE_PROVIDER_SITE_OTHER): Payer: BLUE CROSS/BLUE SHIELD | Admitting: Family Medicine

## 2015-08-03 VITALS — BP 120/80 | HR 70 | Temp 98.4°F | Resp 16 | Ht 64.75 in | Wt 171.0 lb

## 2015-08-03 DIAGNOSIS — Z23 Encounter for immunization: Secondary | ICD-10-CM

## 2015-08-03 DIAGNOSIS — Z13220 Encounter for screening for lipoid disorders: Secondary | ICD-10-CM | POA: Diagnosis not present

## 2015-08-03 DIAGNOSIS — R350 Frequency of micturition: Secondary | ICD-10-CM

## 2015-08-03 DIAGNOSIS — R5383 Other fatigue: Secondary | ICD-10-CM

## 2015-08-03 DIAGNOSIS — F320 Major depressive disorder, single episode, mild: Secondary | ICD-10-CM | POA: Diagnosis not present

## 2015-08-03 DIAGNOSIS — Z131 Encounter for screening for diabetes mellitus: Secondary | ICD-10-CM | POA: Diagnosis not present

## 2015-08-03 DIAGNOSIS — Z130 Encounter for screening for diseases of the blood and blood-forming organs and certain disorders involving the immune mechanism: Secondary | ICD-10-CM

## 2015-08-03 DIAGNOSIS — Z0000 Encounter for general adult medical examination without abnormal findings: Secondary | ICD-10-CM | POA: Diagnosis not present

## 2015-08-03 LAB — COMPREHENSIVE METABOLIC PANEL
ALBUMIN: 4.2 g/dL (ref 3.6–5.1)
ALT: 16 U/L (ref 6–29)
AST: 24 U/L (ref 10–30)
Alkaline Phosphatase: 41 U/L (ref 33–115)
BUN: 7 mg/dL (ref 7–25)
CHLORIDE: 99 mmol/L (ref 98–110)
CO2: 23 mmol/L (ref 20–31)
CREATININE: 0.58 mg/dL (ref 0.50–1.10)
Calcium: 9.1 mg/dL (ref 8.6–10.2)
Glucose, Bld: 83 mg/dL (ref 65–99)
Potassium: 3.6 mmol/L (ref 3.5–5.3)
Sodium: 134 mmol/L — ABNORMAL LOW (ref 135–146)
Total Bilirubin: 0.6 mg/dL (ref 0.2–1.2)
Total Protein: 7.2 g/dL (ref 6.1–8.1)

## 2015-08-03 LAB — POCT URINALYSIS DIPSTICK
Bilirubin, UA: NEGATIVE
Glucose, UA: NEGATIVE
Ketones, UA: 15
Leukocytes, UA: NEGATIVE
Nitrite, UA: NEGATIVE
Protein, UA: NEGATIVE
RBC UA: NEGATIVE
SPEC GRAV UA: 1.01
UROBILINOGEN UA: 0.2
pH, UA: 6

## 2015-08-03 LAB — CBC
HCT: 36.4 % (ref 36.0–46.0)
Hemoglobin: 12.1 g/dL (ref 12.0–15.0)
MCH: 29.9 pg (ref 26.0–34.0)
MCHC: 33.2 g/dL (ref 30.0–36.0)
MCV: 89.9 fL (ref 78.0–100.0)
MPV: 9.6 fL (ref 8.6–12.4)
PLATELETS: 282 10*3/uL (ref 150–400)
RBC: 4.05 MIL/uL (ref 3.87–5.11)
RDW: 13.1 % (ref 11.5–15.5)
WBC: 10.2 10*3/uL (ref 4.0–10.5)

## 2015-08-03 LAB — POCT UA - MICROSCOPIC ONLY
Bacteria, U Microscopic: NEGATIVE
CRYSTALS, UR, HPF, POC: NEGATIVE
Casts, Ur, LPF, POC: NEGATIVE
Mucus, UA: NEGATIVE
RBC, urine, microscopic: NEGATIVE
WBC, UR, HPF, POC: NEGATIVE
Yeast, UA: NEGATIVE

## 2015-08-03 LAB — LIPID PANEL
CHOL/HDL RATIO: 1.9 ratio (ref <=5.0)
Cholesterol: 177 mg/dL (ref 125–200)
HDL: 93 mg/dL (ref >=46)
LDL Cholesterol: 71 mg/dL (ref <130)
TRIGLYCERIDES: 65 mg/dL (ref <150)
VLDL: 13 mg/dL (ref <30)

## 2015-08-03 LAB — TSH: TSH: 1.544 u[IU]/mL (ref 0.350–4.500)

## 2015-08-03 MED ORDER — BUPROPION HCL ER (SR) 150 MG PO TB12: 150.0000 mg | ORAL_TABLET | Freq: Two times a day (BID) | ORAL | Status: DC

## 2015-08-03 NOTE — Patient Instructions (Signed)
Great to see you today- I will be in touch with your labs asap You got your tetanus and flu shots today We will start you on wellbutrin for depression/ anxiety- take 1 once a day for 3 days, then go to twice a day  Good luck with your weight loss efforts- this is certainly a struggle for many!  Adding some weight training may be helpful for increasing your metabolic rate.  Aim for slow and steady loss- 1 lb a week or so

## 2015-08-03 NOTE — Progress Notes (Signed)
[facility] [address], [place] [id number] [phone]  Date:  08/03/2015   Name:  Brittany Gillespie   DOB:  Nov 30, 1975   MRN:  [id number]  PCP:  Arloa Koh, MD    Chief Complaint: Annual Exam   History of Present Illness:  Brittany Gillespie is a 39 y.o. very pleasant female patient who presents with the following:  Here today for a follow-up visit/ CPE  Reviewed controlled sub database- she has been receiving ambien 5 mg approx every 30 days June, July, and August.  Nothing further back.  She does not need a pap.   History of breast calcification in the past- her current plan of treatment is   Last labs about one year ago  She is on OCP, former but not current smoker mammo last year; she needs annual mammo but nothing further from her breast isuse in 2015  She did eat at 0500 this am- this was almost 8 hours ago now She takes her OCP continuously- does not generally get menses.  Managed by her OBG  She took the celexa for a couple of months but felt like she had weight gain- she stopped using it about 2 weeks ago.   She is frustrated as she is trying to watch her weight and her diet She does still use ambien- feel that being able to get some sleep has helped with her depression sx She is feeling a little anxious and down, but not "super sad like I was before."  She is interested in getting on something such as wellbutrin which is not associated with weight gain Denies any SI  She has noted some mild increased urination so we did a UA and micro today  No pregnancy planned soon  Wt Readings from Last 3 Encounters:  08/03/15 171 lb (77.565 kg)  05/31/15 163 lb (73.936 kg)  05/06/15 165 lb (74.844 kg)     Patient Active Problem List   Diagnosis Date Noted  . Breast calcification, left 07/22/2014    Past Medical History  Diagnosis Date  . Breast calcification, left 06/2014  . PONV (postoperative nausea and vomiting)   . Allergy   . Anxiety   .  Depression     Past Surgical History  Procedure Laterality Date  . Anterior cruciate ligament repair Right 2003  . Breast lumpectomy with radioactive seed localization Left 08/04/2014    Procedure: RADIOACTIVE SEED AND WIRE LOCALIZATION LUMPECTOMY LEFT BREAST;  Surgeon: Autumn Messing III, MD;  Location: [facility];  Service: General;  Laterality: Left;  . Breast surgery      Social History  Substance Use Topics  . Smoking status: Former Smoker    Quit date: 11/27/2011  . Smokeless tobacco: Never Used  . Alcohol Use: 0.0 oz/week    0 Standard drinks or equivalent per week     Comment: 1 beer daily    Family History  Problem Relation Age of Onset  . Alzheimer's disease Maternal Grandmother   . Cancer Maternal Grandmother     breast cancer  . Stroke Maternal Grandfather   . Heart disease Maternal Grandfather   . Glaucoma Paternal Grandmother   . Diabetes Paternal Grandfather   . Mental illness Father     No Known Allergies  Medication list has been reviewed and updated.  Current Outpatient Prescriptions on File Prior to Visit  Medication Sig Dispense Refill  . zolpidem (AMBIEN) 5 MG tablet Take 1 tablet (5  mg total) by mouth at bedtime as needed for sleep. 30 tablet 5   No current facility-administered medications on file prior to visit.    Review of Systems:  As per HPI- otherwise negative.   Physical Examination: Filed Vitals:   08/03/15 1314  BP: 120/80  Pulse: 70  Temp: 98.4 F (36.9 C)  Resp: 16   Filed Vitals:   08/03/15 1314  Height: 5' 4.75" (1.645 m)  Weight: 171 lb (77.565 kg)   Body mass index is 28.66 kg/(m^2). Ideal Body Weight: Weight in (lb) to have BMI = 25: 148.8  GEN: WDWN, NAD, Non-toxic, A & O x 3, mild overweight, looks well HEENT: Atraumatic, Normocephalic. Neck supple. No masses, No LAD.  Bilateral TM wnl, oropharynx normal.  PEERL,EOMI.   Ears and Nose: No external deformity. CV: RRR, No M/G/R. No JVD. No thrill. No  extra heart sounds. PULM: CTA B, no wheezes, crackles, rhonchi. No retractions. No resp. distress. No accessory muscle use. ABD: S, NT, ND EXTR: No c/c/e NEURO Normal gait.  PSYCH: Normally interactive. Conversant. Not depressed or anxious appearing.  Calm demeanor.   Results for orders placed or performed in visit on 08/03/15  POCT urinalysis dipstick  Result Value Ref Range   Color, UA Yellow    Clarity, UA Clear    Glucose, UA Negative    Bilirubin, UA Negative    Ketones, UA 15    Spec Grav, UA 1.010    Blood, UA Negative    pH, UA 6.0    Protein, UA Negative    Urobilinogen, UA 0.2    Nitrite, UA negative    Leukocytes, UA Negative Negative  POCT UA - Microscopic Only  Result Value Ref Range   WBC, Ur, HPF, POC Negative    RBC, urine, microscopic Negative    Bacteria, U Microscopic Negative    Mucus, UA Negative    Epithelial cells, urine per micros 0-1    Crystals, Ur, HPF, POC Negative    Casts, Ur, LPF, POC Negative    Yeast, UA Negative     Assessment and Plan: Physical exam  Screening for hyperlipidemia - Plan: Lipid panel  Screening for diabetes mellitus - Plan: Comprehensive metabolic panel, Hemoglobin A1c  Screening for deficiency anemia - Plan: CBC  Immunization due - Plan: Flu Vaccine QUAD 36+ mos IM, Td vaccine greater than or equal to 7yo preservative free IM  Other fatigue - Plan: TSH  Screening for diabetes mellitus, Active - Plan: Comprehensive metabolic panel, Hemoglobin A1c  Urinary frequency - Plan: POCT urinalysis dipstick, POCT UA - Microscopic Only, Urine culture  Major depressive disorder, single episode, mild - Plan: buPROPion (WELLBUTRIN SR) 150 MG 12 hr tablet  Follow-up pending labs She woud like to use medication for anxiety and depression but seemed to put on 10 lbs while on celexa- she would like to try wellbutrin instead which is fine.  She has been off celexa for about 3 weeks now so can start now- daily for 3 days, then  BID She will keep me posted as to her progress Await urine culture but so far he urine looks fine  Signed Lamar Blinks, MD

## 2015-08-04 LAB — URINE CULTURE
Colony Count: NO GROWTH
ORGANISM ID, BACTERIA: NO GROWTH

## 2015-08-05 LAB — HEMOGLOBIN A1C
Hgb A1c MFr Bld: 5.4 % (ref <5.7)
Mean Plasma Glucose: 108 mg/dL (ref <117)

## 2015-08-15 ENCOUNTER — Other Ambulatory Visit: Payer: Self-pay | Admitting: Obstetrics and Gynecology

## 2015-08-15 NOTE — Telephone Encounter (Signed)
Please contact patient to schedule her annual exam with me! I will refill her OCPs for 2 months.  Her last annual was in September 2015.  She came in for a recheck in February 2016.  Thank you.

## 2015-08-15 NOTE — Telephone Encounter (Signed)
Medication refill request: Nori Riis  Last AEX:  01/17/15 Dr. Quincy Simmonds Next AEX: ? Last MMG (if hormonal medication request): calcifications 09/29/14  Refill authorized: Today please advise.

## 2015-08-17 NOTE — Telephone Encounter (Signed)
Left Voicemail to call back re: AEX.

## 2015-08-19 NOTE — Telephone Encounter (Signed)
Second attempt to contact pt. Left Voicemail to call back to schedule AEX.

## 2015-10-07 ENCOUNTER — Other Ambulatory Visit: Payer: Self-pay | Admitting: Obstetrics and Gynecology

## 2015-10-07 NOTE — Telephone Encounter (Signed)
Medication refill request: Nori Riis Last AEX:  07/05/14 Next AEX: 10/18/15 Last MMG (if hormonal medication request): 08/03/14 MRI of Lft Breast Radioactive seed localization of the left breast. No apparent complications.  The seed is approximately 3.5 cm medial to the calcifications, but is at the proper depth. As Dr. Marlou Starks was not in the office this afternoon and I could not contact him directly, I will page him tomorrow morning to inform him of the above. Refill authorized: 08/15/15 #30 1 refill Today #30 No refills?

## 2015-10-18 ENCOUNTER — Ambulatory Visit (INDEPENDENT_AMBULATORY_CARE_PROVIDER_SITE_OTHER): Payer: BLUE CROSS/BLUE SHIELD | Admitting: Obstetrics and Gynecology

## 2015-10-18 ENCOUNTER — Other Ambulatory Visit: Payer: Self-pay

## 2015-10-18 ENCOUNTER — Encounter: Payer: Self-pay | Admitting: Obstetrics and Gynecology

## 2015-10-18 VITALS — BP 122/84 | HR 80 | Resp 18 | Ht 64.5 in | Wt 170.0 lb

## 2015-10-18 DIAGNOSIS — Z01419 Encounter for gynecological examination (general) (routine) without abnormal findings: Secondary | ICD-10-CM

## 2015-10-18 DIAGNOSIS — Z1231 Encounter for screening mammogram for malignant neoplasm of breast: Secondary | ICD-10-CM

## 2015-10-18 MED ORDER — DROSPIRENONE-ETHINYL ESTRADIOL 3-0.02 MG PO TABS
1.0000 | ORAL_TABLET | Freq: Every day | ORAL | Status: DC
Start: 2015-10-18 — End: 2016-07-02

## 2015-10-18 NOTE — Patient Instructions (Signed)

## 2015-10-18 NOTE — Progress Notes (Signed)
39 y.o. G0P0 Married Caucasian female here for annual exam.   Having some hot flashes.  Wondering if she is going through menopause.  Taking continuous OCPs. Had normal thyroid function with PCP.   Taking Wellbutrin and Ambien for sleep disturbance.  PCP: Lamar Blinks - Did blood work.   Patient's last menstrual period was 08/27/2015.          Sexually active: Yes.    The current method of family planning is OCP (estrogen/progesterone).    Exercising: Yes.    Spin, kickbox, walk, weights Smoker:  Former   Health Maintenance: Pap:  07/05/14 Neg. HR HPV:neg History of abnormal Pap:  Yes, Hx of Leep ~ 10 years ago.  MMG: 07/09/14 Diagnostic bilateral and [place] left BIRADS4: suspicious.  09/29/14 MAMMOGRAPHIC GUIDED RADIOACTIVE SEED LOCALIZATION OF THE LEFT BREAST: Radioactive seed localization of the left breast. No apparent complications. The seed is approximately 3.5 cm medial to the calcifications, but is at the proper depth. Colonoscopy:  Never BMD:   Never TDaP:  2016  Screening Labs:  Hb today: PCP, Urine today: PCP   reports that she quit smoking about 3 years ago. She has never used smokeless tobacco. She reports that she drinks alcohol. She reports that she uses illicit drugs (Marijuana).  Past Medical History  Diagnosis Date  . Breast calcification, left 06/2014  . PONV (postoperative nausea and vomiting)   . Allergy   . Anxiety   . Depression   . Abnormal Pap smear of cervix ~2005    Past Surgical History  Procedure Laterality Date  . Anterior cruciate ligament repair Right 2003  . Breast lumpectomy with radioactive seed localization Left 08/04/2014    Procedure: RADIOACTIVE SEED AND WIRE LOCALIZATION LUMPECTOMY LEFT BREAST;  Surgeon: Autumn Messing III, MD;  Location: [facility];  Service: General;  Laterality: Left;  . Breast surgery    . Cervical biopsy  w/ loop electrode excision  ~ 2005    [facility]     Current Outpatient Prescriptions   Medication Sig Dispense Refill  . buPROPion (WELLBUTRIN SR) 150 MG 12 hr tablet Take 1 tablet (150 mg total) by mouth 2 (two) times daily. 60 tablet 6  . cetirizine (ZYRTEC) 10 MG tablet Take 10 mg by mouth daily as needed for allergies.    Marland Kitchen ibuprofen (ADVIL,MOTRIN) 200 MG tablet Take 200 mg by mouth daily as needed.    . VESTURA 3-0.02 MG tablet TAKE 1 TABLET BY MOUTH EVERY DAY 28 tablet 0  . zolpidem (AMBIEN) 5 MG tablet Take 1 tablet (5 mg total) by mouth at bedtime as needed for sleep. 30 tablet 5   No current facility-administered medications for this visit.    Family History  Problem Relation Age of Onset  . Alzheimer's disease Maternal Grandmother   . Cancer Maternal Grandmother     breast cancer  . Stroke Maternal Grandfather   . Heart disease Maternal Grandfather   . Glaucoma Paternal Grandmother   . Diabetes Paternal Grandfather   . Mental illness Father     ROS:  Pertinent items are noted in HPI.  Otherwise, a comprehensive ROS was negative.  Exam:   BP 122/84 mmHg  Pulse 80  Resp 18  Ht 5' 4.5" (1.638 m)  Wt 170 lb (77.111 kg)  BMI 28.74 kg/m2  LMP 08/27/2015    General appearance: alert, cooperative and appears stated age Head: Normocephalic, without obvious abnormality, atraumatic Neck: no adenopathy, supple, symmetrical, trachea midline and  thyroid normal to inspection and palpation Lungs: clear to auscultation bilaterally Breasts: normal appearance, no masses or tenderness, Inspection negative, No nipple retraction or dimpling, No nipple discharge or bleeding, No axillary or supraclavicular adenopathy Heart: regular rate and rhythm Abdomen: soft, non-tender; bowel sounds normal; no masses,  no organomegaly Extremities: extremities normal, atraumatic, no cyanosis or edema Skin: Skin color, texture, turgor normal. No rashes or lesions Lymph nodes: Cervical, supraclavicular, and axillary nodes normal. No abnormal inguinal nodes palpated Neurologic: Grossly  normal  Pelvic: External genitalia:  no lesions              Urethra:  normal appearing urethra with no masses, tenderness or lesions              Bartholins and Skenes: normal                 Vagina: normal appearing vagina with normal color and discharge, no lesions              Cervix: no lesions              Pap taken: No. Bimanual Exam:  Uterus:  normal size, contour, position, consistency, mobility, non-tender              Adnexa: normal adnexa and no mass, fullness, tenderness              Rectovaginal: Yes.  .  Confirms.              Anus:  normal sphincter tone, no lesions  Chaperone was present for exam.  Assessment:   Well woman visit with normal exam. Vasomotor symptoms.  On combined OCPs. Remote hx of LEEP.   Plan: Yearly mammogram recommended after age 40.  Recommended self breast exam.  Pap and HR HPV as above. Discussed Calcium, Vitamin D, regular exercise program including cardiovascular and weight bearing exercise. Labs performed.  No..   See orders. Refills given on medications.  Yes.  .  See orders.  OCPs for one year.  Declines coming off at this time to check her hormones. Follow up annually and prn.     After visit summary provided.

## 2015-10-19 ENCOUNTER — Ambulatory Visit: Payer: BLUE CROSS/BLUE SHIELD | Admitting: Obstetrics and Gynecology

## 2015-11-01 ENCOUNTER — Other Ambulatory Visit: Payer: Self-pay | Admitting: Family Medicine

## 2015-11-03 ENCOUNTER — Other Ambulatory Visit: Payer: Self-pay | Admitting: Obstetrics and Gynecology

## 2015-11-03 NOTE — Telephone Encounter (Signed)
Faxed

## 2015-11-08 ENCOUNTER — Ambulatory Visit: Payer: Self-pay

## 2015-11-09 ENCOUNTER — Ambulatory Visit
Admission: RE | Admit: 2015-11-09 | Discharge: 2015-11-09 | Disposition: A | Discharge status: Home / Self Care | Payer: BLUE CROSS/BLUE SHIELD | Source: Ambulatory Visit

## 2015-11-09 DIAGNOSIS — Z1231 Encounter for screening mammogram for malignant neoplasm of breast: Secondary | ICD-10-CM

## 2015-12-08 ENCOUNTER — Other Ambulatory Visit: Payer: Self-pay | Admitting: Family Medicine

## 2015-12-12 NOTE — Telephone Encounter (Signed)
rx faxed

## 2016-01-17 ENCOUNTER — Other Ambulatory Visit: Payer: Self-pay | Admitting: Family Medicine

## 2016-01-18 NOTE — Telephone Encounter (Signed)
Faxed

## 2016-02-16 ENCOUNTER — Other Ambulatory Visit: Payer: Self-pay | Admitting: Family Medicine

## 2016-02-17 NOTE — Telephone Encounter (Signed)
Faxed

## 2016-02-28 ENCOUNTER — Other Ambulatory Visit: Payer: Self-pay | Admitting: Family Medicine

## 2016-03-20 ENCOUNTER — Other Ambulatory Visit: Payer: Self-pay | Admitting: Family Medicine

## 2016-03-21 NOTE — Telephone Encounter (Signed)
Called in.

## 2016-06-21 ENCOUNTER — Other Ambulatory Visit: Payer: Self-pay | Admitting: Family Medicine

## 2016-07-02 ENCOUNTER — Other Ambulatory Visit: Payer: Self-pay | Admitting: Obstetrics and Gynecology

## 2016-07-02 NOTE — Telephone Encounter (Signed)
Medication refill request: VESTURA 3-0.02 MG Last AEX:  10/18/15 BS Next AEX: 11/09/16 Last MMG (if hormonal medication request): 11/09/15 BI-RADS1 negative Refill authorized: 10/18/15 #3packs w/3 refills; today #3packs w/1 refill? Please advise

## 2016-07-24 ENCOUNTER — Encounter: Payer: Self-pay | Admitting: Family Medicine

## 2016-07-24 ENCOUNTER — Other Ambulatory Visit: Payer: Self-pay | Admitting: Family Medicine

## 2016-07-24 ENCOUNTER — Other Ambulatory Visit: Payer: Self-pay | Admitting: Emergency Medicine

## 2016-07-24 MED ORDER — BUPROPION HCL ER (SR) 150 MG PO TB12: ORAL_TABLET | ORAL | 3 refills | Status: DC

## 2016-07-24 NOTE — Telephone Encounter (Signed)
Received refill request for Wellbutrin. Last office visit 08/03/15 and last refill on 02/28/16. Is it ok for me to refill this medication? Please advise.

## 2016-08-17 ENCOUNTER — Encounter: Payer: Self-pay | Admitting: Family Medicine

## 2016-08-17 MED ORDER — ZOLPIDEM TARTRATE 5 MG PO TABS: 5.0000 mg | ORAL_TABLET | Freq: Every evening | ORAL | 0 refills | Status: DC | PRN

## 2016-09-17 ENCOUNTER — Encounter: Payer: Self-pay | Admitting: Family Medicine

## 2016-09-17 ENCOUNTER — Ambulatory Visit (INDEPENDENT_AMBULATORY_CARE_PROVIDER_SITE_OTHER): Payer: BLUE CROSS/BLUE SHIELD | Admitting: Family Medicine

## 2016-09-17 VITALS — BP 120/88 | HR 86 | Temp 98.5°F | Ht 64.75 in | Wt 174.4 lb

## 2016-09-17 DIAGNOSIS — Z1329 Encounter for screening for other suspected endocrine disorder: Secondary | ICD-10-CM | POA: Diagnosis not present

## 2016-09-17 DIAGNOSIS — Z131 Encounter for screening for diabetes mellitus: Secondary | ICD-10-CM | POA: Diagnosis not present

## 2016-09-17 DIAGNOSIS — Z0000 Encounter for general adult medical examination without abnormal findings: Secondary | ICD-10-CM

## 2016-09-17 DIAGNOSIS — F320 Major depressive disorder, single episode, mild: Secondary | ICD-10-CM

## 2016-09-17 DIAGNOSIS — Z23 Encounter for immunization: Secondary | ICD-10-CM

## 2016-09-17 DIAGNOSIS — Z13220 Encounter for screening for lipoid disorders: Secondary | ICD-10-CM | POA: Diagnosis not present

## 2016-09-17 DIAGNOSIS — E663 Overweight: Secondary | ICD-10-CM

## 2016-09-17 DIAGNOSIS — F32A Depression, unspecified: Secondary | ICD-10-CM | POA: Insufficient documentation

## 2016-09-17 DIAGNOSIS — Z130 Encounter for screening for diseases of the blood and blood-forming organs and certain disorders involving the immune mechanism: Secondary | ICD-10-CM | POA: Diagnosis not present

## 2016-09-17 DIAGNOSIS — G4700 Insomnia, unspecified: Secondary | ICD-10-CM | POA: Insufficient documentation

## 2016-09-17 DIAGNOSIS — F5101 Primary insomnia: Secondary | ICD-10-CM

## 2016-09-17 LAB — COMPREHENSIVE METABOLIC PANEL
ALBUMIN: 4.1 g/dL (ref 3.5–5.2)
ALK PHOS: 42 U/L (ref 39–117)
ALT: 21 U/L (ref 0–35)
AST: 20 U/L (ref 0–37)
BILIRUBIN TOTAL: 0.4 mg/dL (ref 0.2–1.2)
BUN: 8 mg/dL (ref 6–23)
CALCIUM: 9.4 mg/dL (ref 8.4–10.5)
CO2: 26 mEq/L (ref 19–32)
Chloride: 101 mEq/L (ref 96–112)
Creatinine, Ser: 0.69 mg/dL (ref 0.40–1.20)
GFR: 99.37 mL/min (ref >60.00)
GLUCOSE: 93 mg/dL (ref 70–99)
POTASSIUM: 3.7 meq/L (ref 3.5–5.1)
Sodium: 135 mEq/L (ref 135–145)
TOTAL PROTEIN: 7.3 g/dL (ref 6.0–8.3)

## 2016-09-17 LAB — LIPID PANEL
CHOLESTEROL: 179 mg/dL (ref 0–200)
HDL: 84.2 mg/dL (ref >39.00)
LDL Cholesterol: 76 mg/dL (ref 0–99)
NONHDL: 95.28
Total CHOL/HDL Ratio: 2
Triglycerides: 94 mg/dL (ref 0.0–149.0)
VLDL: 18.8 mg/dL (ref 0.0–40.0)

## 2016-09-17 LAB — CBC
HEMATOCRIT: 36.5 % (ref 36.0–46.0)
HEMOGLOBIN: 12.4 g/dL (ref 12.0–15.0)
MCHC: 34 g/dL (ref 30.0–36.0)
MCV: 88.7 fl (ref 78.0–100.0)
PLATELETS: 268 10*3/uL (ref 150.0–400.0)
RBC: 4.12 Mil/uL (ref 3.87–5.11)
RDW: 13.2 % (ref 11.5–15.5)
WBC: 8.1 10*3/uL (ref 4.0–10.5)

## 2016-09-17 LAB — HEMOGLOBIN A1C: Hgb A1c MFr Bld: 5.6 % (ref 4.6–6.5)

## 2016-09-17 LAB — TSH: TSH: 1.55 u[IU]/mL (ref 0.35–4.50)

## 2016-09-17 MED ORDER — BUPROPION HCL ER (SR) 150 MG PO TB12: ORAL_TABLET | ORAL | 3 refills | Status: DC

## 2016-09-17 MED ORDER — ZOLPIDEM TARTRATE 5 MG PO TABS: 5.0000 mg | ORAL_TABLET | Freq: Every evening | ORAL | 1 refills | Status: DC | PRN

## 2016-09-17 NOTE — Progress Notes (Signed)
Pre visit review using our clinic review tool, if applicable. No additional management support is needed unless otherwise documented below in the visit note. 

## 2016-09-17 NOTE — Patient Instructions (Signed)
It was very nice to see you again today- I will be in touch with your labs and will see you at spin class!  Your BP looks fine today

## 2016-09-17 NOTE — Progress Notes (Signed)
[facility] [address], [place], [place] [address] [phone]  Date:  09/17/2016   Name:  Brittany Gillespie   DOB:  Sep 25, 1976   MRN:  [id number]  PCP:  Arloa Koh, MD    Chief Complaint: Annual Exam (Pt here for annual. Will see GYN in Dec. Flu vaccine today. Will need refills today. )   History of Present Illness:  Brittany Gillespie is a 40 y.o. very pleasant female patient who presents with the following:  Here today for a CPE.  She does see OBG and will see them in December Flu shot today  We changed from celexa to wellbutrin last year in hopes that it would help with weight loss.  She is traveling a lot for work- this is stressful but not so bad.  She has started a fall fitness challenge at the gym (we often see each other in class) and is enjoying this  She still uses [place] as needed for sleep; she may use it 1-2x a week.  Uses it more when she travels  She is taking wellbutrin- she still feels "fine," her energy level is ok.   She is using OCP from her GYN She is fasting today for labs  Former smoker but has quit She does not have any other concerns today   Wt Readings from Last 3 Encounters:  09/17/16 174 lb 6.4 oz (79.1 kg)  10/18/15 170 lb (77.1 kg)  08/03/15 171 lb (77.6 kg)   BP Readings from Last 3 Encounters:  09/17/16 120/88  10/18/15 122/84  08/03/15 120/80    Patient Active Problem List   Diagnosis Date Noted  . Breast calcification, left 07/22/2014    Past Medical History:  Diagnosis Date  . Abnormal Pap smear of cervix ~2005  . Allergy   . Anxiety   . Breast calcification, left 06/2014  . Depression   . PONV (postoperative nausea and vomiting)     Past Surgical History:  Procedure Laterality Date  . ANTERIOR CRUCIATE LIGAMENT REPAIR Right 2003  . BREAST LUMPECTOMY WITH RADIOACTIVE SEED LOCALIZATION Left 08/04/2014   Procedure: RADIOACTIVE SEED AND WIRE LOCALIZATION LUMPECTOMY  LEFT BREAST;  Surgeon: Autumn Messing III, MD;  Location: [facility];  Service: General;  Laterality: Left;  . BREAST SURGERY    . CERVICAL BIOPSY  W/ LOOP ELECTRODE EXCISION  ~ 2005   [facility]     Social History  Substance Use Topics  . Smoking status: Former Smoker    Quit date: 11/27/2011  . Smokeless tobacco: Never Used  . Alcohol use 0.0 oz/week     Comment: 1 beer daily    Family History  Problem Relation Age of Onset  . Alzheimer's disease Maternal Grandmother   . Cancer Maternal Grandmother     breast cancer  . Stroke Maternal Grandfather   . Heart disease Maternal Grandfather   . Glaucoma Paternal Grandmother   . Diabetes Paternal Grandfather   . Mental illness Father     No Known Allergies  Medication list has been reviewed and updated.  Current Outpatient Prescriptions on File Prior to Visit  Medication Sig Dispense Refill  . buPROPion (WELLBUTRIN SR) 150 MG 12 hr tablet TAKE 1 TABLET(150 MG) BY MOUTH TWICE DAILY 60 tablet 3  . cetirizine (ZYRTEC) 10 MG tablet Take 10 mg by mouth daily as needed for allergies.    Marland Kitchen ibuprofen (ADVIL,MOTRIN) 200 MG tablet  Take 200 mg by mouth daily as needed.    . VESTURA 3-0.02 MG tablet TAKE 1 TABLET BY MOUTH EVERY DAY 84 tablet 1  . zolpidem (AMBIEN) 5 MG tablet Take 1 tablet (5 mg total) by mouth at bedtime as needed. for sleep 30 tablet 0   No current facility-administered medications on file prior to visit.     Review of Systems:  As per HPI- otherwise negative. No fever, chills, nausea, vomiting, CP or SOB   Physical Examination: Blood pressure 120/88, pulse 86, temperature 98.5 F (36.9 C), temperature source Oral, height 5' 4.75" (1.645 m), weight 174 lb 6.4 oz (79.1 kg), SpO2 99 %.  Vitals:   09/17/16 0954  Weight: 174 lb 6.4 oz (79.1 kg)  Height: 5' 4.75" (1.645 m)   Body mass index is 29.25 kg/m. Ideal Body Weight: Weight in (lb) to have BMI = 25: 148.8  GEN: WDWN, NAD, Non-toxic, A  & O x 3, overweight but OW looks well HEENT: Atraumatic, Normocephalic. Neck supple. No masses, No LAD.  Bilateral TM wnl, oropharynx normal.  PEERL,EOMI.   Ears and Nose: No external deformity. CV: RRR, No M/G/R. No JVD. No thrill. No extra heart sounds. PULM: CTA B, no wheezes, crackles, rhonchi. No retractions. No resp. distress. No accessory muscle use. ABD: S, NT, ND EXTR: No c/c/e NEURO Normal gait.  PSYCH: Normally interactive. Conversant. Not depressed or anxious appearing.  Calm demeanor.    Assessment and Plan: Physical exam  Screening for deficiency anemia - Plan: CBC  Screening for hyperlipidemia - Plan: Lipid panel  Screening for diabetes mellitus - Plan: Comprehensive metabolic panel, Hemoglobin A1C  Screening for thyroid disorder - Plan: TSH  Major depressive disorder, single episode, mild ([place]) - Plan: buPROPion (WELLBUTRIN SR) 150 MG 12 hr tablet  Primary insomnia - Plan: zolpidem (AMBIEN) 5 MG tablet  Encounter for immunization - Plan: Flu Vaccine QUAD 36+ mos IM  Labs as above- Will plan further follow- up pending labs. Refilled her [place] and wellbutrin today  Signed Lamar Blinks, MD

## 2016-09-24 ENCOUNTER — Ambulatory Visit (INDEPENDENT_AMBULATORY_CARE_PROVIDER_SITE_OTHER): Payer: BLUE CROSS/BLUE SHIELD | Admitting: Sports Medicine

## 2016-09-24 ENCOUNTER — Encounter: Payer: Self-pay | Admitting: Sports Medicine

## 2016-09-24 DIAGNOSIS — G8929 Other chronic pain: Secondary | ICD-10-CM | POA: Diagnosis not present

## 2016-09-24 DIAGNOSIS — M25562 Pain in left knee: Secondary | ICD-10-CM | POA: Insufficient documentation

## 2016-09-24 NOTE — Progress Notes (Signed)
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 Surgical scar R Palpation normal with no warmth or joint line tenderness or patellar tenderness or condyle tenderness. ROM normal in flexion and extension and lower leg rotation. Ligaments with solid consistent endpoints including ACL, PCL, LCL, MCL. Negative Mcmurray's and provocative meniscal tests. Non painful patellar compression but she does have patellar crepitation  Patellar and quadriceps tendons unremarkable. Hamstring and quadriceps strength is normal.  Has significant weakness of left hip abductor Dynamic 1 leg squat and step down - less stable on left

## 2016-09-24 NOTE — Assessment & Plan Note (Signed)
She needs a hip series to work on abduction Do these daily Add some 1 foot quad exercsies with minisquats/ wall slides  Reck 6 weeks Reassured that I think she can get back to running and other activities without an unstable knee

## 2016-10-05 ENCOUNTER — Other Ambulatory Visit: Payer: Self-pay | Admitting: Obstetrics and Gynecology

## 2016-10-05 DIAGNOSIS — Z1231 Encounter for screening mammogram for malignant neoplasm of breast: Secondary | ICD-10-CM

## 2016-11-07 ENCOUNTER — Ambulatory Visit: Payer: BLUE CROSS/BLUE SHIELD | Admitting: Sports Medicine

## 2016-11-09 ENCOUNTER — Ambulatory Visit: Payer: BLUE CROSS/BLUE SHIELD | Admitting: Obstetrics and Gynecology

## 2016-11-12 ENCOUNTER — Ambulatory Visit
Admission: RE | Admit: 2016-11-12 | Discharge: 2016-11-12 | Disposition: A | Discharge status: Home / Self Care | Payer: BLUE CROSS/BLUE SHIELD | Source: Ambulatory Visit | Attending: Obstetrics and Gynecology | Admitting: Obstetrics and Gynecology

## 2016-11-12 DIAGNOSIS — Z1231 Encounter for screening mammogram for malignant neoplasm of breast: Secondary | ICD-10-CM

## 2016-11-22 NOTE — Progress Notes (Deleted)
40 y.o. G0P0 Married Caucasian female here for annual exam.    PCP:     No LMP recorded. Patient is not currently having periods (Reason: Oral contraceptives).           Sexually active: {yes no:314532}  The current method of family planning is {contraception:315051}.    Exercising: {yes ML:465035}  {types:19826} Smoker:  Former  Health Maintenance: Pap:  07-05-14 Neg:Neg HR HPV History of abnormal Pap:  Yes, Hx of LEEP 2006 MMG:  11-12-16 Density C/Neg/BiRads1:TBC Colonoscopy: n/a  BMD:   n/a  Result  n/a TDaP:  2016 Gardasil:   N/A HIV: Hep C: Screening Labs:  Hb today: ***, Urine today: ***   reports that she quit smoking about 4 years ago. She has never used smokeless tobacco. She reports that she drinks alcohol. She reports that she uses drugs, including Marijuana.  Past Medical History:  Diagnosis Date  . Abnormal Pap smear of cervix ~2005  . Allergy   . Anxiety   . Breast calcification, left 06/2014  . Depression   . PONV (postoperative nausea and vomiting)     Past Surgical History:  Procedure Laterality Date  . ANTERIOR CRUCIATE LIGAMENT REPAIR Right 2003  . BREAST LUMPECTOMY WITH RADIOACTIVE SEED LOCALIZATION Left 08/04/2014   Procedure: RADIOACTIVE SEED AND WIRE LOCALIZATION LUMPECTOMY LEFT BREAST;  Surgeon: Autumn Messing III, MD;  Location: [place];  Service: General;  Laterality: Left;  . BREAST SURGERY    . CERVICAL BIOPSY  W/ LOOP ELECTRODE EXCISION  ~ 2005   [facility]     Current Outpatient Prescriptions  Medication Sig Dispense Refill  . buPROPion (WELLBUTRIN SR) 150 MG 12 hr tablet TAKE 1 TABLET(150 MG) BY MOUTH TWICE DAILY 180 tablet 3  . cetirizine (ZYRTEC) 10 MG tablet Take 10 mg by mouth daily as needed for allergies.    Marland Kitchen ibuprofen (ADVIL,MOTRIN) 200 MG tablet Take 200 mg by mouth daily as needed.    . VESTURA 3-0.02 MG tablet TAKE 1 TABLET BY MOUTH EVERY DAY 84 tablet 1  . zolpidem (AMBIEN) 5 MG tablet Take 1 tablet (5  mg total) by mouth at bedtime as needed. for sleep 30 tablet 1   No current facility-administered medications for this visit.     Family History  Problem Relation Age of Onset  . Alzheimer's disease Maternal Grandmother   . Cancer Maternal Grandmother     breast cancer  . Stroke Maternal Grandfather   . Heart disease Maternal Grandfather   . Glaucoma Paternal Grandmother   . Diabetes Paternal Grandfather   . Mental illness Father     ROS:  Pertinent items are noted in HPI.  Otherwise, a comprehensive ROS was negative.  Exam:   There were no vitals taken for this visit.    General appearance: alert, cooperative and appears stated age Head: Normocephalic, without obvious abnormality, atraumatic Neck: no adenopathy, supple, symmetrical, trachea midline and thyroid normal to inspection and palpation Lungs: clear to auscultation bilaterally Breasts: normal appearance, no masses or tenderness, No nipple retraction or dimpling, No nipple discharge or bleeding, No axillary or supraclavicular adenopathy Heart: regular rate and rhythm Abdomen: soft, non-tender; no masses, no organomegaly Extremities: extremities normal, atraumatic, no cyanosis or edema Skin: Skin color, texture, turgor normal. No rashes or lesions Lymph nodes: Cervical, supraclavicular, and axillary nodes normal. No abnormal inguinal nodes palpated Neurologic: Grossly normal  Pelvic: External genitalia:  no lesions  Urethra:  normal appearing urethra with no masses, tenderness or lesions              Bartholins and Skenes: normal                 Vagina: normal appearing vagina with normal color and discharge, no lesions              Cervix: no lesions              Pap taken: {yes no:314532} Bimanual Exam:  Uterus:  normal size, contour, position, consistency, mobility, non-tender              Adnexa: no mass, fullness, tenderness              Rectal exam: {yes no:314532}.  Confirms.              Anus:   normal sphincter tone, no lesions  Chaperone was present for exam.  Assessment:   Well woman visit with normal exam.   Plan: Mammogram screening discussed. Recommended self breast awareness. Pap and HR HPV as above. Guidelines for Calcium, Vitamin D, regular exercise program including cardiovascular and weight bearing exercise.   Follow up annually and prn.   Additional counseling given.  {yes [id number]. _______ minutes face to face time of which over 50% was spent in counseling.    After visit summary provided.

## 2016-11-27 ENCOUNTER — Other Ambulatory Visit: Payer: Self-pay | Admitting: Obstetrics and Gynecology

## 2016-11-27 NOTE — Telephone Encounter (Signed)
Medication refill request: [place] Last AEX:  10/18/15 BS Next AEX: 12/03/16 BS Last MMG (if hormonal medication request): 11/13/16 BIRADS1, Density C, [facility] Refill authorized: 07/02/16 #[id number] 1R. Please advise. Thank you.

## 2016-11-28 ENCOUNTER — Ambulatory Visit: Payer: BLUE CROSS/BLUE SHIELD | Admitting: Obstetrics and Gynecology

## 2016-12-03 ENCOUNTER — Ambulatory Visit (INDEPENDENT_AMBULATORY_CARE_PROVIDER_SITE_OTHER): Payer: 59 | Admitting: Nurse Practitioner

## 2016-12-03 ENCOUNTER — Encounter: Payer: Self-pay | Admitting: Nurse Practitioner

## 2016-12-03 ENCOUNTER — Ambulatory Visit: Payer: BLUE CROSS/BLUE SHIELD | Admitting: Obstetrics and Gynecology

## 2016-12-03 VITALS — BP 120/74 | HR 76 | Ht 64.25 in | Wt 177.0 lb

## 2016-12-03 DIAGNOSIS — Z01419 Encounter for gynecological examination (general) (routine) without abnormal findings: Secondary | ICD-10-CM | POA: Diagnosis not present

## 2016-12-03 DIAGNOSIS — Z0000 Encounter for general adult medical examination without abnormal findings: Secondary | ICD-10-CM

## 2016-12-03 MED ORDER — DROSPIRENONE-ETHINYL ESTRADIOL 3-0.02 MG PO TABS: 1.0000 | ORAL_TABLET | Freq: Every day | ORAL | 4 refills | Status: DC

## 2016-12-03 NOTE — Patient Instructions (Signed)

## 2016-12-03 NOTE — Progress Notes (Signed)
Patient ID: Brittany Gillespie, female   DOB: 14-Mar-1976, 41 y.o.   MRN: [id number]  41 y.o. G0P0000 Married  Caucasian Fe here for annual exam.  On OCP Yaz since 08/2014.  Amenorrhea on OCP.  Did have a light cycle 07/2016 when she came off the pill.  She feels well on the pill and is aware of potential risk including DVT, CVA, cancer.  Patient's last menstrual period was 07/27/2016.          Sexually active: Yes.    The current method of family planning is OCP (estrogen/progesterone).    Exercising: Yes.    Gym/ health club routine includes spin, cardio and weights. Smoker:  no  Health Maintenance: Pap: 07/05/14, Negative with neg HR HPV MMG: 11/12/16, Bi-Rads 1: Negative TDaP: 08/03/15  HIV: blood donor Labs: PCP takes care of all labs, in EPIC   reports that she quit smoking about 5 years ago. She has never used smokeless tobacco. She reports that she drinks alcohol. She reports that she uses drugs, including Marijuana.  Past Medical History:  Diagnosis Date  . Abnormal Pap smear of cervix ~2005  . Allergy   . Anxiety   . Breast calcification, left 06/2014  . Depression   . PONV (postoperative nausea and vomiting)     Past Surgical History:  Procedure Laterality Date  . ANTERIOR CRUCIATE LIGAMENT REPAIR Right 2003  . BREAST LUMPECTOMY WITH RADIOACTIVE SEED LOCALIZATION Left 08/04/2014   Procedure: RADIOACTIVE SEED AND WIRE LOCALIZATION LUMPECTOMY LEFT BREAST;  Surgeon: Autumn Messing III, MD;  Location: [place];  Service: General;  Laterality: Left;  . BREAST SURGERY    . CERVICAL BIOPSY  W/ LOOP ELECTRODE EXCISION  ~ 2005   [facility]     Current Outpatient Prescriptions  Medication Sig Dispense Refill  . buPROPion (WELLBUTRIN SR) 150 MG 12 hr tablet TAKE 1 TABLET(150 MG) BY MOUTH TWICE DAILY 180 tablet 3  . cetirizine (ZYRTEC) 10 MG tablet Take 10 mg by mouth daily as needed for allergies.    . drospirenone-ethinyl estradiol (NIKKI) 3-0.02 MG tablet Take 1  tablet by mouth daily. Take continuous active pills  3 months = 4 packs 4 Package 4  . ibuprofen (ADVIL,MOTRIN) 200 MG tablet Take 200 mg by mouth daily as needed.    . zolpidem (AMBIEN) 5 MG tablet Take 1 tablet (5 mg total) by mouth at bedtime as needed. for sleep 30 tablet 1   No current facility-administered medications for this visit.     Family History  Problem Relation Age of Onset  . Alzheimer's disease Maternal Grandmother   . Cancer Maternal Grandmother     breast cancer  . Stroke Maternal Grandfather   . Heart disease Maternal Grandfather   . Glaucoma Paternal Grandmother   . Diabetes Paternal Grandfather   . Mental illness Father     ROS:  Pertinent items are noted in HPI.  Otherwise, a comprehensive ROS was negative.  Exam:   BP 120/74 (BP Location: Right Arm, Patient Position: Sitting, Cuff Size: Large)   Pulse 76   Ht 5' 4.25" (1.632 m)   Wt 177 lb (80.3 kg)   LMP 07/27/2016 Comment: continuous OCP  BMI 30.15 kg/m  Height: 5' 4.25" (163.2 cm) Ht Readings from Last 3 Encounters:  12/03/16 5' 4.25" (1.632 m)  09/24/16 5' 5"  (1.651 m)  09/17/16 5' 4.75" (1.645 m)    General appearance: alert, cooperative and appears stated age Head: Normocephalic, without obvious abnormality,  atraumatic Neck: no adenopathy, supple, symmetrical, trachea midline and thyroid normal to inspection and palpation Lungs: clear to auscultation bilaterally Breasts: normal appearance, no masses or tenderness Heart: regular rate and rhythm Abdomen: soft, non-tender; no masses,  no organomegaly Extremities: extremities normal, atraumatic, no cyanosis or edema Skin: Skin color, texture, turgor normal. No rashes or lesions Lymph nodes: Cervical, supraclavicular, and axillary nodes normal. No abnormal inguinal nodes palpated Neurologic: Grossly normal   Pelvic: External genitalia:  no lesions              Urethra:  normal appearing urethra with no masses, tenderness or lesions               Bartholin's and Skene's: normal                 Vagina: normal appearing vagina with normal color and discharge, no lesions              Cervix: anteverted              Pap taken: Yes.   Bimanual Exam:  Uterus:  normal size, contour, position, consistency, mobility, non-tender              Adnexa: no mass, fullness, tenderness               Rectovaginal: Confirms               Anus:  normal sphincter tone, no lesions  Chaperone present: yes  A:  Well Woman with normal exam  Vasomotor symptoms that are tolerable  On combined OCPs since 08/2014  Remote hx of LEEP ~ 2005   P:   Reviewed health and wellness pertinent to exam  Pap smear as above  Mammogram is due 10/2017  Will check Vit D and follow  Counseled on breast self exam, mammography screening, use and side effects of OCP's, adequate intake of calcium and vitamin D, diet and exercise return annually or prn  An After Visit Summary was printed and given to the patient.

## 2016-12-04 LAB — VITAMIN D 25 HYDROXY (VIT D DEFICIENCY, FRACTURES): Vit D, 25-Hydroxy: 30 ng/mL (ref 30–100)

## 2016-12-04 LAB — IPS PAP TEST WITH HPV

## 2016-12-09 NOTE — Progress Notes (Signed)
Encounter reviewed by Dr. Aundria Rud.

## 2016-12-27 ENCOUNTER — Other Ambulatory Visit: Payer: Self-pay | Admitting: Family Medicine

## 2016-12-27 MED ORDER — OSELTAMIVIR PHOSPHATE 75 MG PO CAPS: 75.0000 mg | ORAL_CAPSULE | Freq: Every day | ORAL | 0 refills | Status: DC

## 2016-12-27 NOTE — Progress Notes (Signed)
Husband likely with influenza though test negative high pretest probability so will prophylax wife as well.

## 2017-05-21 ENCOUNTER — Encounter: Payer: Self-pay | Admitting: Family Medicine

## 2017-05-28 NOTE — Progress Notes (Signed)
Brittany Gillespie at [facility] [address] [place], [place] [address] [phone]  Date:  05/30/2017   Name:  Brittany Gillespie   DOB:  11/18/1976   MRN:  [id number]  PCP:  Darreld Mclean, MD    Chief Complaint: Medication Problem (Pt here to discuss Wellbutrin. )   History of Present Illness:  Brittany Gillespie is a 41 y.o. very pleasant female patient who presents with the following:  Here today with concern of mood symptoms.   I last saw her in October for her CPE- at that time all ws going well.   She got a new position at her job in January of this year.  She was really forced into this position as her old job was eliminated- it was take the new position or leave.  Her job is a lot worse since this change, and she is very stressed out.  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  She does not like this work and it is making her really anxious.  She does not feel that she is cut out for trying to make aggressive sales and she is constantly feeling like a failure  She is working on finding something new, but has not found the right fit yet.  They send out an email ranking all the sales people every week which goes to everyone in the company.  Brittany Gillespie is embarrassed, feels like the weakest link and is afraid that she is going to get fired.    Her husband was laid off in December so she is not able to just quit her job right now.  He is trying to start his own business- he is really happy and excited about this so she does not want to show him that she is miserable at work.    She is sleeping pretty well While not a work, her mood is ok until she thinks about work She is feeling more negative about life all around However no SI   She does not take her Lorrin Mais really   discussed coming off Wellbutrin and changing to another medication. However she overall likes wellbutrin and does not want to stop using it.  After discussion we decided  to try adding buspar to her regimen.   Patient Active Problem List   Diagnosis Date Noted  . Knee pain, left 09/24/2016  . Insomnia 09/17/2016  . Mild depression ([place]) 09/17/2016  . Overweight 09/17/2016  . Breast calcification, left 07/22/2014    Past Medical History:  Diagnosis Date  . Abnormal Pap smear of cervix ~2005  . Allergy   . Anxiety   . Breast calcification, left 06/2014  . Depression   . PONV (postoperative nausea and vomiting)     Past Surgical History:  Procedure Laterality Date  . ANTERIOR CRUCIATE LIGAMENT REPAIR Right 2003  . BREAST LUMPECTOMY WITH RADIOACTIVE SEED LOCALIZATION Left 08/04/2014   Procedure: RADIOACTIVE SEED AND WIRE LOCALIZATION LUMPECTOMY LEFT BREAST;  Surgeon: Autumn Messing III, MD;  Location: [place];  Service: General;  Laterality: Left;  . BREAST SURGERY    . CERVICAL BIOPSY  W/ LOOP ELECTRODE EXCISION  ~ 2005   [facility]     Social History  Substance Use Topics  . Smoking status: Former Smoker    Quit date: 11/27/2011  . Smokeless tobacco: Never Used  . Alcohol use 0.0 oz/week     Comment: 1 beer  daily    Family History  Problem Relation Age of Onset  . Alzheimer's disease Maternal Grandmother   . Cancer Maternal Grandmother        breast cancer  . Stroke Maternal Grandfather   . Heart disease Maternal Grandfather   . Glaucoma Paternal Grandmother   . Diabetes Paternal Grandfather   . Mental illness Father     No Known Allergies  Medication list has been reviewed and updated.  Current Outpatient Prescriptions on File Prior to Visit  Medication Sig Dispense Refill  . buPROPion (WELLBUTRIN SR) 150 MG 12 hr tablet TAKE 1 TABLET(150 MG) BY MOUTH TWICE DAILY 180 tablet 3  . cetirizine (ZYRTEC) 10 MG tablet Take 10 mg by mouth daily as needed for allergies.    . drospirenone-ethinyl estradiol (NIKKI) 3-0.02 MG tablet Take 1 tablet by mouth daily. Take continuous active pills  3 months = 4 packs 4  Package 4  . ibuprofen (ADVIL,MOTRIN) 200 MG tablet Take 200 mg by mouth daily as needed.    . zolpidem (AMBIEN) 5 MG tablet Take 1 tablet (5 mg total) by mouth at bedtime as needed. for sleep 30 tablet 1   No current facility-administered medications on file prior to visit.     Review of Systems:  As per HPI- otherwise negative.   Physical Examination: Vitals:   05/30/17 1434  BP: 129/81  Pulse: 86  Temp: 98.5 F (36.9 C)   Vitals:   05/30/17 1434  Weight: 176 lb 12.8 oz (80.2 kg)  Height: 5' 4.25" (1.632 m)   Body mass index is 30.11 kg/m. Ideal Body Weight: Weight in (lb) to have BMI = 25: 146.5  GEN: WDWN, NAD, Non-toxic, A & O x 3, overweight, otherwise looks well HEENT: Atraumatic, Normocephalic. Neck supple. No masses, No LAD. Ears and Nose: No external deformity. CV: RRR, No M/G/R. No JVD. No thrill. No extra heart sounds. PULM: CTA B, no wheezes, crackles, rhonchi. No retractions. No resp. distress. No accessory muscle use. EXTR: No c/c/e NEURO Normal gait.  PSYCH: Normally interactive. Conversant. Not depressed or anxious appearing.  Sometimes a bit tearful, but overall calm demeanor.    Assessment and Plan: GAD (generalized anxiety disorder) - Plan: busPIRone (BUSPAR) 7.5 MG tablet  Stressful life events affecting family and household  History of depression, well controlled on wellbutrin.  However now suffering from a lot of anxiety due to job stressors.  Decided to add buspar to her regimen- start at 7.5 mg BID, she will let me know how this works for her and we can titrate up her dose as needed  Signed Lamar Blinks, MD

## 2017-05-30 ENCOUNTER — Ambulatory Visit (INDEPENDENT_AMBULATORY_CARE_PROVIDER_SITE_OTHER): Payer: 59 | Admitting: Family Medicine

## 2017-05-30 VITALS — BP 129/81 | HR 86 | Temp 98.5°F | Ht 64.25 in | Wt 176.8 lb

## 2017-05-30 DIAGNOSIS — F411 Generalized anxiety disorder: Secondary | ICD-10-CM | POA: Diagnosis not present

## 2017-05-30 DIAGNOSIS — Z6379 Other stressful life events affecting family and household: Secondary | ICD-10-CM | POA: Diagnosis not present

## 2017-05-30 MED ORDER — BUSPIRONE HCL 7.5 MG PO TABS: 7.5000 mg | ORAL_TABLET | Freq: Two times a day (BID) | ORAL | 3 refills | Status: DC

## 2017-05-30 NOTE — Patient Instructions (Addendum)
I am sorry that you are having such a hard time.  We hope that you will be feeling better soon! Continue your Wellbutrin but we are going to add buspar.  Start with 7.5 mg twice a day- we will probably go up.  Please send me a message in 10- 14 days and let me know how you are doing  Counseling may be a helpful thing for you if you are interested.

## 2017-08-22 ENCOUNTER — Other Ambulatory Visit: Payer: Self-pay | Admitting: Emergency Medicine

## 2017-08-22 DIAGNOSIS — F5101 Primary insomnia: Secondary | ICD-10-CM

## 2017-08-22 NOTE — Telephone Encounter (Signed)
Requesting: zolpidem (AMBIEN) 5 MG tablet Contract: UDS:  Last OV: 05/30/2017 Last Refill: 09/17/2016  Please Advise

## 2017-08-23 MED ORDER — ZOLPIDEM TARTRATE 5 MG PO TABS: 5.0000 mg | ORAL_TABLET | Freq: Every evening | ORAL | 1 refills | Status: DC | PRN

## 2017-08-23 NOTE — Telephone Encounter (Signed)
Reviewed [place]- ok to refill

## 2017-09-30 ENCOUNTER — Other Ambulatory Visit: Payer: Self-pay | Admitting: Obstetrics and Gynecology

## 2017-09-30 DIAGNOSIS — Z139 Encounter for screening, unspecified: Secondary | ICD-10-CM

## 2017-11-13 ENCOUNTER — Ambulatory Visit
Admission: RE | Admit: 2017-11-13 | Discharge: 2017-11-13 | Disposition: A | Discharge status: Home / Self Care | Payer: 59 | Source: Ambulatory Visit | Attending: Obstetrics and Gynecology | Admitting: Obstetrics and Gynecology

## 2017-11-13 DIAGNOSIS — Z139 Encounter for screening, unspecified: Secondary | ICD-10-CM

## 2017-11-27 ENCOUNTER — Telehealth: Payer: Self-pay | Admitting: Obstetrics and Gynecology

## 2017-11-27 NOTE — Telephone Encounter (Signed)
Left message for patient to call and reschedule 12/06/17 appt with Dr Quincy Simmonds.

## 2017-12-06 ENCOUNTER — Ambulatory Visit: Payer: 59 | Admitting: Obstetrics and Gynecology

## 2017-12-06 ENCOUNTER — Ambulatory Visit: Payer: 59 | Admitting: Nurse Practitioner

## 2018-01-08 ENCOUNTER — Other Ambulatory Visit: Payer: Self-pay | Admitting: Family Medicine

## 2018-01-08 DIAGNOSIS — F320 Major depressive disorder, single episode, mild: Secondary | ICD-10-CM

## 2018-01-08 DIAGNOSIS — F411 Generalized anxiety disorder: Secondary | ICD-10-CM

## 2018-01-10 NOTE — Telephone Encounter (Signed)
Received refill request for Buspirone and Bupropion. Last office visit 05/30/17 and last refill 09/17/16. Refills sent to pharmacy.

## 2018-01-13 ENCOUNTER — Ambulatory Visit: Payer: 59 | Admitting: Obstetrics and Gynecology

## 2018-02-09 ENCOUNTER — Other Ambulatory Visit: Payer: Self-pay | Admitting: Family Medicine

## 2018-02-09 DIAGNOSIS — F411 Generalized anxiety disorder: Secondary | ICD-10-CM

## 2018-02-14 ENCOUNTER — Other Ambulatory Visit: Payer: Self-pay

## 2018-02-14 ENCOUNTER — Encounter: Payer: Self-pay | Admitting: Obstetrics and Gynecology

## 2018-02-14 ENCOUNTER — Ambulatory Visit (INDEPENDENT_AMBULATORY_CARE_PROVIDER_SITE_OTHER): Payer: 59 | Admitting: Obstetrics and Gynecology

## 2018-02-14 VITALS — BP 114/70 | HR 76 | Resp 16 | Ht 64.25 in | Wt 179.0 lb

## 2018-02-14 DIAGNOSIS — Z01419 Encounter for gynecological examination (general) (routine) without abnormal findings: Secondary | ICD-10-CM | POA: Diagnosis not present

## 2018-02-14 MED ORDER — DROSPIRENONE-ETHINYL ESTRADIOL 3-0.02 MG PO TABS: 1.0000 | ORAL_TABLET | Freq: Every day | ORAL | 4 refills | Status: DC

## 2018-02-14 NOTE — Progress Notes (Signed)
42 y.o. G19P0000 Married Caucasian female here for annual exam.    Doing continuous OCPs.  Does not take placebos. Would like a refill.   Quit smoking 6 years ago.   PCP:  Dr. Silvestre Mesi  No LMP recorded (lmp unknown). (Menstrual status: Oral contraceptives).           Sexually active: Yes.    The current method of family planning is [place].    Exercising: Yes.    spinning, weights, walking Smoker:  no  Health Maintenance: Pap:  12/03/16 Pap and HR HPV negative History of abnormal Pap:  Yes, years ago, Hx of Leep  MMG:  11/13/17 BIRADS 1 negative/density c TDaP:  08/03/15 Gardasil:  no HIV and Hep C: donated blood Screening Labs:  PCP   reports that she quit smoking about 6 years ago. Her smoking use included cigarettes. She has never used smokeless tobacco. She reports that she drinks alcohol. She reports that she has current or past drug history. Drug: Marijuana.  Past Medical History:  Diagnosis Date  . Abnormal Pap smear of cervix ~2005  . Allergy   . Anxiety   . Breast calcification, left 06/2014  . Depression   . PONV (postoperative nausea and vomiting)     Past Surgical History:  Procedure Laterality Date  . ANTERIOR CRUCIATE LIGAMENT REPAIR Right 2003  . BREAST LUMPECTOMY WITH RADIOACTIVE SEED LOCALIZATION Left 08/04/2014   Procedure: RADIOACTIVE SEED AND WIRE LOCALIZATION LUMPECTOMY LEFT BREAST;  Surgeon: Autumn Messing III, MD;  Location: [place];  Service: General;  Laterality: Left;  . BREAST SURGERY    . CERVICAL BIOPSY  W/ LOOP ELECTRODE EXCISION  ~ 2005   [facility]   . tooth implant      Current Outpatient Medications  Medication Sig Dispense Refill  . buPROPion (WELLBUTRIN SR) 150 MG 12 hr tablet TAKE 1 TABLET(150 MG) BY MOUTH TWICE DAILY 180 tablet 0  . busPIRone (BUSPAR) 7.5 MG tablet TAKE 1 TABLET(7.5 MG) BY MOUTH TWICE DAILY 60 tablet 0  . cetirizine (ZYRTEC) 10 MG tablet Take 10 mg by mouth daily as needed for  allergies.    . drospirenone-ethinyl estradiol (NIKKI) 3-0.02 MG tablet Take 1 tablet by mouth daily. Take continuous active pills  3 months = 4 packs 4 Package 4  . ibuprofen (ADVIL,MOTRIN) 200 MG tablet Take 200 mg by mouth daily as needed.    . zolpidem (AMBIEN) 5 MG tablet Take 1 tablet (5 mg total) by mouth at bedtime as needed. for sleep (Patient not taking: Reported on 02/14/2018) 30 tablet 1   No current facility-administered medications for this visit.     Family History  Problem Relation Age of Onset  . Alzheimer's disease Maternal Grandmother   . Cancer Maternal Grandmother        breast cancer  . Stroke Maternal Grandfather   . Heart disease Maternal Grandfather   . Glaucoma Paternal Grandmother   . Diabetes Paternal Grandfather   . Mental illness Father   . Breast cancer Neg Hx     ROS:  Pertinent items are noted in HPI.  Otherwise, a comprehensive ROS was negative.  Exam:   BP 114/70 (BP Location: Right Arm, Patient Position: Sitting, Cuff Size: Large)   Pulse 76   Resp 16   Ht 5' 4.25" (1.632 m)   Wt 179 lb (81.2 kg)   LMP  (LMP Unknown)   BMI 30.49 kg/m     General appearance: alert, cooperative  and appears stated age Head: Normocephalic, without obvious abnormality, atraumatic Neck: no adenopathy, supple, symmetrical, trachea midline and thyroid normal to inspection and palpation Lungs: clear to auscultation bilaterally Breasts: normal appearance, no masses or tenderness, No nipple retraction or dimpling, No nipple discharge or bleeding, No axillary or supraclavicular adenopathy Heart: regular rate and rhythm Abdomen: soft, non-tender; no masses, no organomegaly Extremities: extremities normal, atraumatic, no cyanosis or edema Skin: Skin color, texture, turgor normal. No rashes or lesions Lymph nodes: Cervical, supraclavicular, and axillary nodes normal. No abnormal inguinal nodes palpated Neurologic: Grossly normal  Pelvic: External genitalia:  no  lesions              Urethra:  normal appearing urethra with no masses, tenderness or lesions              Bartholins and Skenes: normal                 Vagina: normal appearing vagina with normal color and discharge, no lesions              Cervix: no lesions.  Slight bloody mucous.              Pap taken: No. Bimanual Exam:  Uterus:  normal size, contour, position, consistency, mobility, non-tender              Adnexa: no mass, fullness, tenderness              Rectal exam: Yes.  .  Confirms.              Anus:  normal sphincter tone, no lesions  Chaperone was present for exam.  Assessment:   Well woman visit with normal exam. Hx LEEP.  Plan: Mammogram screening discussed. Recommended self breast awareness. Pap and HR HPV as above. Guidelines for Calcium, Vitamin D, regular exercise program including cardiovascular and weight bearing exercise. Discussed Gardasil vaccine.  She declines.  Labs with PCP.  Follow up annually and prn.     After visit summary provided.

## 2018-02-14 NOTE — Patient Instructions (Signed)

## 2018-02-28 ENCOUNTER — Ambulatory Visit: Payer: 59 | Admitting: Family Medicine

## 2018-02-28 ENCOUNTER — Encounter: Payer: Self-pay | Admitting: Family Medicine

## 2018-02-28 VITALS — BP 112/82 | HR 87 | Temp 98.6°F | Ht 64.0 in | Wt 180.0 lb

## 2018-02-28 DIAGNOSIS — T148XXA Other injury of unspecified body region, initial encounter: Secondary | ICD-10-CM

## 2018-02-28 DIAGNOSIS — L0292 Furuncle, unspecified: Secondary | ICD-10-CM

## 2018-02-28 MED ORDER — BACITRACIN 500 UNIT/GM EX OINT: 1.0000 "application " | TOPICAL_OINTMENT | Freq: Two times a day (BID) | CUTANEOUS | 0 refills | Status: AC

## 2018-02-28 NOTE — Progress Notes (Signed)
Chief Complaint  Patient presents with  . nose infection    Brittany Gillespie is a 42 y.o. female here for a skin complaint.  Duration: 3 d Location: inside of nose Pruritic? No Painful? Yes Drainage? No Other associated symptoms: none Therapies tried thus far: None No hx of MRSA  ROS:  Const: No fevers Skin: As noted in HPI  Past Medical History:  Diagnosis Date  . Abnormal Pap smear of cervix ~2005  . Allergy   . Anxiety   . Breast calcification, left 06/2014  . Depression   . PONV (postoperative nausea and vomiting)    No Known Allergies   BP 112/82 (BP Location: Left Arm, Patient Position: Sitting, Cuff Size: Large)   Pulse 87   Temp 98.6 F (37 C) (Oral)   Ht 5' 4"  (1.626 m)   Wt 180 lb (81.6 kg)   LMP  (LMP Unknown)   SpO2 97%   BMI 30.90 kg/m  Gen: awake, alert, appearing stated age Lungs: No accessory muscle use Skin: R nare- latera portion of nare has small enlargement that is TTP, no pustular formation or drainage, no erythema; there is also an area of excoriation over the septum. There is no external erythema noted. Psych: Age appropriate judgment and insight  Furuncle - Plan: bacitracin 500 UNIT/GM ointment  Excoriation - Plan: bacitracin 500 UNIT/GM ointment  Very pleasant female presents for likely ingrown hair from blowing nose freq 2/2 allergies. TAO bid for 7-10 days. I don't think she has an abscess or cellulitis. If no improvement, would certainly consider Bactrim. Air humidifier, try not to pick at it, Vaseline.  F/u prn. The patient voiced understanding and agreement to the plan.  [place], DO 02/28/18 10:43 AM

## 2018-02-28 NOTE — Patient Instructions (Addendum)
Things to look out for: fevers, worsening pain, drainage of pus/thick white drainage, spreading redness.   Triple antibiotic (like Neosporin) twice daily for 7-10 days. Vaseline as much or as little as you want. I called some in to your pharmacy, get whichever is cheaper.  Air humidifier.   Try not to pick at it.   Let us know if you need anything.

## 2018-02-28 NOTE — Progress Notes (Signed)
Pre visit review using our clinic review tool, if applicable. No additional management support is needed unless otherwise documented below in the visit note. 

## 2018-08-21 ENCOUNTER — Other Ambulatory Visit: Payer: Self-pay | Admitting: Family Medicine

## 2018-08-21 ENCOUNTER — Encounter: Payer: Self-pay | Admitting: Family Medicine

## 2018-08-21 DIAGNOSIS — F320 Major depressive disorder, single episode, mild: Secondary | ICD-10-CM

## 2018-08-21 MED ORDER — BUPROPION HCL ER (SR) 150 MG PO TB12: ORAL_TABLET | ORAL | 1 refills | Status: DC

## 2018-08-21 NOTE — Telephone Encounter (Signed)
Requesting refill- has not been refilled since 01/2018.

## 2018-08-31 NOTE — Progress Notes (Addendum)
[place] [facility] [place], [place], [place] [address] [phone]  Date:  09/03/2018   Name:  Brittany Gillespie   DOB:  22-May-1976   MRN:  [id number]  PCP:  Darreld Mclean, MD    Chief Complaint: Annual Exam (lab work) and Flu Vaccine   History of Present Illness:  Brittany Gillespie is a 42 y.o. very pleasant female patient who presents with the following:  Annual CPE today Generally in good health except for overweight She changed to a different job and her same company and this is much better She is working with non- profits as an Veterinary surgeon  She sees Dr. Quincy Simmonds for her GYN care Labs: due today, fasting today  Immun: flu- done today  mammo is UTD  wellbutrin BID  Her mood is good She is sleeping ok- she is getting to sleep ok but will wake up very early sometimes otc unisom helps here  She is exercising as she is able with her work trips - enjoys spin class  No CP or SOB with exercise Doing fine on her current contraceptive   Wt Readings from Last 3 Encounters:  09/03/18 182 lb 9.6 oz (82.8 kg)  02/28/18 180 lb (81.6 kg)  02/14/18 179 lb (81.2 kg)   They had a good summer, traveled to [place], some work trips all over the place  She has an annual complete skin check with derm  Her MGM did have breast cancer No family history of colon cancer   She notes more difficulty maintaining her weight and has put on about 10 lbs over the last 2 years Discussed adding weight training - she does mostly cardio.  She is also trying intermittent fasting over the last few days which is fine   Patient Active Problem List   Diagnosis Date Noted  . Knee pain, left 09/24/2016  . Insomnia 09/17/2016  . Mild depression ([place]) 09/17/2016  . Overweight 09/17/2016  . Breast calcification, left 07/22/2014    Past Medical History:  Diagnosis Date  . Abnormal Pap smear of cervix ~2005  . Allergy   . Anxiety   . Breast  calcification, left 06/2014  . Depression   . PONV (postoperative nausea and vomiting)     Past Surgical History:  Procedure Laterality Date  . ANTERIOR CRUCIATE LIGAMENT REPAIR Right 2003  . BREAST LUMPECTOMY WITH RADIOACTIVE SEED LOCALIZATION Left 08/04/2014   Procedure: RADIOACTIVE SEED AND WIRE LOCALIZATION LUMPECTOMY LEFT BREAST;  Surgeon: Autumn Messing III, MD;  Location: [facility];  Service: General;  Laterality: Left;  . BREAST SURGERY    . CERVICAL BIOPSY  W/ LOOP ELECTRODE EXCISION  ~ 2005   [facility]   . tooth implant      Social History   Tobacco Use  . Smoking status: Former Smoker    Types: Cigarettes    Last attempt to quit: 11/27/2011    Years since quitting: 6.7  . Smokeless tobacco: Never Used  Substance Use Topics  . Alcohol use: Yes    Alcohol/week: 0.0 standard drinks    Comment: 1 beer 3-4 weekly  . Drug use: Yes    Types: Marijuana    Comment: last used 1 week ago    Family History  Problem Relation Age of Onset  . Alzheimer's disease Maternal Grandmother   . Cancer Maternal Grandmother        breast cancer  . Stroke  Maternal Grandfather   . Heart disease Maternal Grandfather   . Glaucoma Paternal Grandmother   . Diabetes Paternal Grandfather   . Mental illness Father   . Breast cancer Neg Hx     No Known Allergies  Medication list has been reviewed and updated.  Current Outpatient Medications on File Prior to Visit  Medication Sig Dispense Refill  . buPROPion (WELLBUTRIN SR) 150 MG 12 hr tablet Take twice a day 60 tablet 1  . cetirizine (ZYRTEC) 10 MG tablet Take 10 mg by mouth daily as needed for allergies.    . drospirenone-ethinyl estradiol (NIKKI) 3-0.02 MG tablet Take 1 tablet by mouth daily. Take continuous active pills  3 months = 4 packs 4 Package 4  . ibuprofen (ADVIL,MOTRIN) 200 MG tablet Take 200 mg by mouth daily as needed.     No current facility-administered medications on file prior to visit.      Review of Systems:  As per HPI- otherwise negative.   Physical Examination: Vitals:   09/03/18 1046  BP: 122/80  Pulse: 94  Resp: 16  Temp: 98.5 F (36.9 C)  SpO2: 95%   Vitals:   09/03/18 1046  Weight: 182 lb 9.6 oz (82.8 kg)  Height: 5' 4"  (1.626 m)   Body mass index is 31.34 kg/m. Ideal Body Weight: Weight in (lb) to have BMI = 25: 145.3  GEN: WDWN, NAD, Non-toxic, A & O x 3, overweight, looks well  HEENT: Atraumatic, Normocephalic. Neck supple. No masses, No LAD. Bilateral TM wnl, oropharynx normal.  PEERL,EOMI.   Ears and Nose: No external deformity. CV: RRR, No M/G/R. No JVD. No thrill. No extra heart sounds. PULM: CTA B, no wheezes, crackles, rhonchi. No retractions. No resp. distress. No accessory muscle use. ABD: S, NT, ND EXTR: No c/c/e NEURO Normal gait.  PSYCH: Normally interactive. Conversant. Not depressed or anxious appearing.  Calm demeanor.    Assessment and Plan: Physical exam  Screening for hyperlipidemia - Plan: Lipid panel  Screening for diabetes mellitus - Plan: Comprehensive metabolic panel, Hemoglobin A1c  Screening for deficiency anemia - Plan: CBC  Major depressive disorder, single episode, mild ([place]) - Plan: buPROPion (WELLBUTRIN SR) 150 MG 12 hr tablet  CPE as above GYN care per her gynecologist Refilled her wellbutrin which is working well for her Discussed her weight and strategies to address gain Will plan further follow- up pending labs.   Signed Lamar Blinks, MD  Received her labs, message to pt  Results for orders placed or performed in visit on 09/03/18  CBC  Result Value Ref Range   WBC 8.5 4.0 - 10.5 K/uL   RBC 4.00 3.87 - 5.11 Mil/uL   Platelets 298.0 150.0 - 400.0 K/uL   Hemoglobin 12.1 12.0 - 15.0 g/dL   HCT 35.9 (L) 36.0 - 46.0 %   MCV 89.8 78.0 - 100.0 fl   MCHC 33.7 30.0 - 36.0 g/dL   RDW 13.1 11.5 - 15.5 %  Comprehensive metabolic panel  Result Value Ref Range   Sodium 134 (L) 135 - 145 mEq/L    Potassium 3.9 3.5 - 5.1 mEq/L   Chloride 100 96 - 112 mEq/L   CO2 25 19 - 32 mEq/L   Glucose, Bld 100 (H) 70 - 99 mg/dL   BUN 10 6 - 23 mg/dL   Creatinine, Ser 0.71 0.40 - 1.20 mg/dL   Total Bilirubin 0.4 0.2 - 1.2 mg/dL   Alkaline Phosphatase 51 39 - 117 U/L   AST 14  0 - 37 U/L   ALT 13 0 - 35 U/L   Total Protein 7.2 6.0 - 8.3 g/dL   Albumin 4.1 3.5 - 5.2 g/dL   Calcium 9.4 8.4 - 10.5 mg/dL   GFR 95.25 >60.00 mL/min  Hemoglobin A1c  Result Value Ref Range   Hgb A1c MFr Bld 5.3 4.6 - 6.5 %  Lipid panel  Result Value Ref Range   Cholesterol 176 0 - 200 mg/dL   Triglycerides 90.0 0.0 - 149.0 mg/dL   HDL 84.60 >39.00 mg/dL   VLDL 18.0 0.0 - 40.0 mg/dL   LDL Cholesterol 73 0 - 99 mg/dL   Total CHOL/HDL Ratio 2    NonHDL 91.21

## 2018-09-03 ENCOUNTER — Ambulatory Visit (INDEPENDENT_AMBULATORY_CARE_PROVIDER_SITE_OTHER): Payer: 59 | Admitting: Family Medicine

## 2018-09-03 ENCOUNTER — Encounter: Payer: Self-pay | Admitting: Family Medicine

## 2018-09-03 VITALS — BP 122/80 | HR 94 | Temp 98.5°F | Resp 16 | Ht 64.0 in | Wt 182.6 lb

## 2018-09-03 DIAGNOSIS — F320 Major depressive disorder, single episode, mild: Secondary | ICD-10-CM

## 2018-09-03 DIAGNOSIS — Z0000 Encounter for general adult medical examination without abnormal findings: Secondary | ICD-10-CM | POA: Diagnosis not present

## 2018-09-03 DIAGNOSIS — Z130 Encounter for screening for diseases of the blood and blood-forming organs and certain disorders involving the immune mechanism: Secondary | ICD-10-CM

## 2018-09-03 DIAGNOSIS — Z131 Encounter for screening for diabetes mellitus: Secondary | ICD-10-CM | POA: Diagnosis not present

## 2018-09-03 DIAGNOSIS — Z13220 Encounter for screening for lipoid disorders: Secondary | ICD-10-CM

## 2018-09-03 DIAGNOSIS — Z23 Encounter for immunization: Secondary | ICD-10-CM | POA: Diagnosis not present

## 2018-09-03 LAB — COMPREHENSIVE METABOLIC PANEL
ALT: 13 U/L (ref 0–35)
AST: 14 U/L (ref 0–37)
Albumin: 4.1 g/dL (ref 3.5–5.2)
Alkaline Phosphatase: 51 U/L (ref 39–117)
BUN: 10 mg/dL (ref 6–23)
CHLORIDE: 100 meq/L (ref 96–112)
CO2: 25 meq/L (ref 19–32)
Calcium: 9.4 mg/dL (ref 8.4–10.5)
Creatinine, Ser: 0.71 mg/dL (ref 0.40–1.20)
GFR: 95.25 mL/min (ref >60.00)
GLUCOSE: 100 mg/dL — AB (ref 70–99)
POTASSIUM: 3.9 meq/L (ref 3.5–5.1)
SODIUM: 134 meq/L — AB (ref 135–145)
Total Bilirubin: 0.4 mg/dL (ref 0.2–1.2)
Total Protein: 7.2 g/dL (ref 6.0–8.3)

## 2018-09-03 LAB — CBC
HEMATOCRIT: 35.9 % — AB (ref 36.0–46.0)
Hemoglobin: 12.1 g/dL (ref 12.0–15.0)
MCHC: 33.7 g/dL (ref 30.0–36.0)
MCV: 89.8 fl (ref 78.0–100.0)
Platelets: 298 10*3/uL (ref 150.0–400.0)
RBC: 4 Mil/uL (ref 3.87–5.11)
RDW: 13.1 % (ref 11.5–15.5)
WBC: 8.5 10*3/uL (ref 4.0–10.5)

## 2018-09-03 LAB — LIPID PANEL
CHOL/HDL RATIO: 2
Cholesterol: 176 mg/dL (ref 0–200)
HDL: 84.6 mg/dL (ref >39.00)
LDL Cholesterol: 73 mg/dL (ref 0–99)
NONHDL: 91.21
Triglycerides: 90 mg/dL (ref 0.0–149.0)
VLDL: 18 mg/dL (ref 0.0–40.0)

## 2018-09-03 LAB — HEMOGLOBIN A1C: Hgb A1c MFr Bld: 5.3 % (ref 4.6–6.5)

## 2018-09-03 MED ORDER — BUPROPION HCL ER (SR) 150 MG PO TB12: ORAL_TABLET | ORAL | 3 refills | Status: DC

## 2018-09-03 NOTE — Patient Instructions (Signed)
Wonderful to see you today-  I will be in touch with your labs asap Try adding some weight training to your exercise regimen.  Intermittent fasting is also certainly worth trying If you are not able to budge your weight in 2 months or so please alert me  Otherwise continue your healthy lifestyle   Health Maintenance, Female Adopting a healthy lifestyle and getting preventive care can go a long way to promote health and wellness. Talk with your health care provider about what schedule of regular examinations is right for you. This is a good chance for you to check in with your provider about disease prevention and staying healthy. In between checkups, there are plenty of things you can do on your own. Experts have done a lot of research about which lifestyle changes and preventive measures are most likely to keep you healthy. Ask your health care provider for more information. Weight and diet Eat a healthy diet  Be sure to include plenty of vegetables, fruits, low-fat dairy products, and lean protein.  Do not eat a lot of foods high in solid fats, added sugars, or salt.  Get regular exercise. This is one of the most important things you can do for your health. ? Most adults should exercise for at least 150 minutes each week. The exercise should increase your heart rate and make you sweat (moderate-intensity exercise). ? Most adults should also do strengthening exercises at least twice a week. This is in addition to the moderate-intensity exercise.  Maintain a healthy weight  Body mass index (BMI) is a measurement that can be used to identify possible weight problems. It estimates body fat based on height and weight. Your health care provider can help determine your BMI and help you achieve or maintain a healthy weight.  For females 12 years of age and older: ? A BMI below 18.5 is considered underweight. ? A BMI of 18.5 to 24.9 is normal. ? A BMI of 25 to 29.9 is considered overweight. ? A  BMI of 30 and above is considered obese.  Watch levels of cholesterol and blood lipids  You should start having your blood tested for lipids and cholesterol at 43 years of age, then have this test every 5 years.  You may need to have your cholesterol levels checked more often if: ? Your lipid or cholesterol levels are high. ? You are older than 43 years of age. ? You are at high risk for heart disease.  Cancer screening Lung Cancer  Lung cancer screening is recommended for adults 61-12 years old who are at high risk for lung cancer because of a history of smoking.  A yearly low-dose CT scan of the lungs is recommended for people who: ? Currently smoke. ? Have quit within the past 15 years. ? Have at least a 30-pack-year history of smoking. A pack year is smoking an average of one pack of cigarettes a day for 1 year.  Yearly screening should continue until it has been 15 years since you quit.  Yearly screening should stop if you develop a health problem that would prevent you from having lung cancer treatment.  Breast Cancer  Practice breast self-awareness. This means understanding how your breasts normally appear and feel.  It also means doing regular breast self-exams. Let your health care provider know about any changes, no matter how small.  If you are in your 20s or 30s, you should have a clinical breast exam (CBE) by a health care provider  every 1-3 years as part of a regular health exam.  If you are 41 or older, have a CBE every year. Also consider having a breast X-ray (mammogram) every year.  If you have a family history of breast cancer, talk to your health care provider about genetic screening.  If you are at high risk for breast cancer, talk to your health care provider about having an MRI and a mammogram every year.  Breast cancer gene (BRCA) assessment is recommended for women who have family members with BRCA-related cancers. BRCA-related cancers  include: ? Breast. ? Ovarian. ? Tubal. ? Peritoneal cancers.  Results of the assessment will determine the need for genetic counseling and BRCA1 and BRCA2 testing.  Cervical Cancer Your health care provider may recommend that you be screened regularly for cancer of the pelvic organs (ovaries, uterus, and vagina). This screening involves a pelvic examination, including checking for microscopic changes to the surface of your cervix (Pap test). You may be encouraged to have this screening done every 3 years, beginning at age 65.  For women ages 44-65, health care providers may recommend pelvic exams and Pap testing every 3 years, or they may recommend the Pap and pelvic exam, combined with testing for human papilloma virus (HPV), every 5 years. Some types of HPV increase your risk of cervical cancer. Testing for HPV may also be done on women of any age with unclear Pap test results.  Other health care providers may not recommend any screening for nonpregnant women who are considered low risk for pelvic cancer and who do not have symptoms. Ask your health care provider if a screening pelvic exam is right for you.  If you have had past treatment for cervical cancer or a condition that could lead to cancer, you need Pap tests and screening for cancer for at least 20 years after your treatment. If Pap tests have been discontinued, your risk factors (such as having a new sexual partner) need to be reassessed to determine if screening should resume. Some women have medical problems that increase the chance of getting cervical cancer. In these cases, your health care provider may recommend more frequent screening and Pap tests.  Colorectal Cancer  This type of cancer can be detected and often prevented.  Routine colorectal cancer screening usually begins at 43 years of age and continues through 42 years of age.  Your health care provider may recommend screening at an earlier age if you have risk factors  for colon cancer.  Your health care provider may also recommend using home test kits to check for hidden blood in the stool.  A small camera at the end of a tube can be used to examine your colon directly (sigmoidoscopy or colonoscopy). This is done to check for the earliest forms of colorectal cancer.  Routine screening usually begins at age 19.  Direct examination of the colon should be repeated every 5-10 years through 43 years of age. However, you may need to be screened more often if early forms of precancerous polyps or small growths are found.  Skin Cancer  Check your skin from head to toe regularly.  Tell your health care provider about any new moles or changes in moles, especially if there is a change in a mole's shape or color.  Also tell your health care provider if you have a mole that is larger than the size of a pencil eraser.  Always use sunscreen. Apply sunscreen liberally and repeatedly throughout the day.  Protect yourself by wearing long sleeves, pants, a wide-brimmed hat, and sunglasses whenever you are outside.  Heart disease, diabetes, and high blood pressure  High blood pressure causes heart disease and increases the risk of stroke. High blood pressure is more likely to develop in: ? People who have blood pressure in the high end of the normal range (130-139/85-89 mm Hg). ? People who are overweight or obese. ? People who are African American.  If you are 57-39 years of age, have your blood pressure checked every 3-5 years. If you are 19 years of age or older, have your blood pressure checked every year. You should have your blood pressure measured twice-once when you are at a hospital or clinic, and once when you are not at a hospital or clinic. Record the average of the two measurements. To check your blood pressure when you are not at a hospital or clinic, you can use: ? An automated blood pressure machine at a pharmacy. ? A home blood pressure monitor.  If  you are between 19 years and 38 years old, ask your health care provider if you should take aspirin to prevent strokes.  Have regular diabetes screenings. This involves taking a blood sample to check your fasting blood sugar level. ? If you are at a normal weight and have a low risk for diabetes, have this test once every three years after 43 years of age. ? If you are overweight and have a high risk for diabetes, consider being tested at a younger age or more often. Preventing infection Hepatitis B  If you have a higher risk for hepatitis B, you should be screened for this virus. You are considered at high risk for hepatitis B if: ? You were born in a country where hepatitis B is common. Ask your health care provider which countries are considered high risk. ? Your parents were born in a high-risk country, and you have not been immunized against hepatitis B (hepatitis B vaccine). ? You have HIV or AIDS. ? You use needles to inject street drugs. ? You live with someone who has hepatitis B. ? You have had sex with someone who has hepatitis B. ? You get hemodialysis treatment. ? You take certain medicines for conditions, including cancer, organ transplantation, and autoimmune conditions.  Hepatitis C  Blood testing is recommended for: ? Everyone born from [birth year] through 1966. ? Anyone with known risk factors for hepatitis C.  Sexually transmitted infections (STIs)  You should be screened for sexually transmitted infections (STIs) including gonorrhea and chlamydia if: ? You are sexually active and are younger than 43 years of age. ? You are older than 43 years of age and your health care provider tells you that you are at risk for this type of infection. ? Your sexual activity has changed since you were last screened and you are at an increased risk for chlamydia or gonorrhea. Ask your health care provider if you are at risk.  If you do not have HIV, but are at risk, it may be recommended  that you take a prescription medicine daily to prevent HIV infection. This is called pre-exposure prophylaxis (PrEP). You are considered at risk if: ? You are sexually active and do not regularly use condoms or know the HIV status of your partner(s). ? You take drugs by injection. ? You are sexually active with a partner who has HIV.  Talk with your health care provider about whether you are at high risk of  being infected with HIV. If you choose to begin PrEP, you should first be tested for HIV. You should then be tested every 3 months for as long as you are taking PrEP. Pregnancy  If you are premenopausal and you may become pregnant, ask your health care provider about preconception counseling.  If you may become pregnant, take 400 to 800 micrograms (mcg) of folic acid every day.  If you want to prevent pregnancy, talk to your health care provider about birth control (contraception). Osteoporosis and menopause  Osteoporosis is a disease in which the bones lose minerals and strength with aging. This can result in serious bone fractures. Your risk for osteoporosis can be identified using a bone density scan.  If you are 63 years of age or older, or if you are at risk for osteoporosis and fractures, ask your health care provider if you should be screened.  Ask your health care provider whether you should take a calcium or vitamin D supplement to lower your risk for osteoporosis.  Menopause may have certain physical symptoms and risks.  Hormone replacement therapy may reduce some of these symptoms and risks. Talk to your health care provider about whether hormone replacement therapy is right for you. Follow these instructions at home:  Schedule regular health, dental, and eye exams.  Stay current with your immunizations.  Do not use any tobacco products including cigarettes, chewing tobacco, or electronic cigarettes.  If you are pregnant, do not drink alcohol.  If you are  breastfeeding, limit how much and how often you drink alcohol.  Limit alcohol intake to no more than 1 drink per day for nonpregnant women. One drink equals 12 ounces of beer, 5 ounces of wine, or 1 ounces of hard liquor.  Do not use street drugs.  Do not share needles.  Ask your health care provider for help if you need support or information about quitting drugs.  Tell your health care provider if you often feel depressed.  Tell your health care provider if you have ever been abused or do not feel safe at home. This information is not intended to replace advice given to you by your health care provider. Make sure you discuss any questions you have with your health care provider. Document Released: 05/28/2011 Document Revised: 04/19/2016 Document Reviewed: 08/16/2015 Elsevier Interactive Patient Education  [employer].

## 2018-10-22 ENCOUNTER — Other Ambulatory Visit: Payer: Self-pay | Admitting: Family Medicine

## 2018-10-22 DIAGNOSIS — F320 Major depressive disorder, single episode, mild: Secondary | ICD-10-CM

## 2018-11-20 ENCOUNTER — Encounter (HOSPITAL_COMMUNITY): Payer: Self-pay | Admitting: Emergency Medicine

## 2018-11-20 ENCOUNTER — Ambulatory Visit (HOSPITAL_COMMUNITY)
Admission: EM | Admit: 2018-11-20 | Discharge: 2018-11-20 | Disposition: A | Discharge status: Home / Self Care | Payer: 59 | Attending: Family Medicine | Admitting: Family Medicine

## 2018-11-20 ENCOUNTER — Other Ambulatory Visit: Payer: Self-pay

## 2018-11-20 DIAGNOSIS — B9789 Other viral agents as the cause of diseases classified elsewhere: Secondary | ICD-10-CM | POA: Diagnosis not present

## 2018-11-20 DIAGNOSIS — J069 Acute upper respiratory infection, unspecified: Secondary | ICD-10-CM

## 2018-11-20 MED ORDER — PREDNISONE 20 MG PO TABS: ORAL_TABLET | ORAL | 0 refills | Status: DC

## 2018-11-20 MED ORDER — HYDROCODONE-HOMATROPINE 5-1.5 MG/5ML PO SYRP: 5.0000 mL | ORAL_SOLUTION | Freq: Four times a day (QID) | ORAL | 0 refills | Status: DC | PRN

## 2018-11-20 NOTE — ED Provider Notes (Signed)
[place]    CSN: [id number] Arrival date & time: 11/20/18  1056     History   Chief Complaint Chief Complaint  Patient presents with  . Cough  . Appointment    11:45 am    HPI Breckyn Troyer is a 42 y.o. female.   Initial visit to [facility] urgent care.  Started feeling bad one week ago.  Yesterday symptoms worsened.  Symptoms include body aches, sore throat , cough and sneezing, and chest tightness and laryngitis     Past Medical History:  Diagnosis Date  . Abnormal Pap smear of cervix ~2005  . Allergy   . Anxiety   . Breast calcification, left 06/2014  . Depression   . PONV (postoperative nausea and vomiting)     Patient Active Problem List   Diagnosis Date Noted  . Knee pain, left 09/24/2016  . Insomnia 09/17/2016  . Mild depression ([place]) 09/17/2016  . Overweight 09/17/2016  . Breast calcification, left 07/22/2014    Past Surgical History:  Procedure Laterality Date  . ANTERIOR CRUCIATE LIGAMENT REPAIR Right 2003  . BREAST LUMPECTOMY WITH RADIOACTIVE SEED LOCALIZATION Left 08/04/2014   Procedure: RADIOACTIVE SEED AND WIRE LOCALIZATION LUMPECTOMY LEFT BREAST;  Surgeon: Autumn Messing III, MD;  Location: [place];  Service: General;  Laterality: Left;  . BREAST SURGERY    . CERVICAL BIOPSY  W/ LOOP ELECTRODE EXCISION  ~ 2005   [facility]   . tooth implant      OB History    Gravida  0   Para  0   Term  0   Preterm  0   AB  0   Living  0     SAB  0   TAB  0   Ectopic  0   Multiple  0   Live Births  0            Home Medications    Prior to Admission medications   Medication Sig Start Date End Date Taking? Authorizing Provider  buPROPion [facility] SR) 150 MG 12 hr tablet Take twice a day 09/03/18   Copland, Gay Filler, MD  buPROPion (WELLBUTRIN SR) 150 MG 12 hr tablet TAKE 1 TABLET BY MOUTH TWICE DAILY 10/22/18   Copland, Gay Filler, MD  cetirizine (ZYRTEC) 10 MG tablet Take 10 mg by  mouth daily as needed for allergies.    [provider]  drospirenone-ethinyl estradiol (NIKKI) 3-0.02 MG tablet Take 1 tablet by mouth daily. Take continuous active pills  3 months = 4 packs 02/14/18   Yisroel Ramming, Everardo All, MD  HYDROcodone-homatropine (HYDROMET) 5-1.5 MG/5ML syrup Take 5 mLs by mouth every 6 (six) hours as needed for cough. 11/20/18   Robyn Haber, MD  ibuprofen (ADVIL,MOTRIN) 200 MG tablet Take 200 mg by mouth daily as needed.    [provider]  predniSONE (DELTASONE) 20 MG tablet Two daily with food 11/20/18   Robyn Haber, MD    Family History Family History  Problem Relation Age of Onset  . Alzheimer's disease Maternal Grandmother   . Cancer Maternal Grandmother        breast cancer  . Stroke Maternal Grandfather   . Heart disease Maternal Grandfather   . Glaucoma Paternal Grandmother   . Diabetes Paternal Grandfather   . Mental illness Father   . Breast cancer Neg Hx     Social History Social History   Tobacco Use  . Smoking status: Former Smoker  Types: Cigarettes    Last attempt to quit: 11/27/2011    Years since quitting: 6.9  . Smokeless tobacco: Never Used  Substance Use Topics  . Alcohol use: Yes    Alcohol/week: 0.0 standard drinks    Comment: 1 beer 3-4 weekly  . Drug use: Yes    Types: Marijuana    Comment: last used 1 week ago     Allergies   Patient has no known allergies.   Review of Systems Review of Systems  HENT: Positive for congestion.        Hoarse  Respiratory: Positive for cough.      Physical Exam Triage Vital Signs ED Triage Vitals  Enc Vitals Group     BP 11/20/18 1210 (!) 151/96     Pulse Rate 11/20/18 1210 88     Resp 11/20/18 1210 18     Temp 11/20/18 1210 98.5 F (36.9 C)     Temp Source 11/20/18 1210 Oral     SpO2 11/20/18 1210 98 %     Weight --      Height --      Head Circumference --      Peak Flow --      Pain Score 11/20/18 1207 2     Pain Loc --      Pain Edu?  --      Excl. in [place]? --    No data found.  Updated Vital Signs BP (!) 151/96 (BP Location: Left Arm)   Pulse 88   Temp 98.5 F (36.9 C) (Oral)   Resp 18   SpO2 98%   Physical Exam Vitals signs and nursing note reviewed.  Constitutional:      Appearance: Normal appearance. She is not ill-appearing.  HENT:     Head: Normocephalic.     Comments: hoarse    Right Ear: Tympanic membrane, ear canal and external ear normal.     Left Ear: Tympanic membrane, ear canal and external ear normal.     Nose: Congestion present.     Mouth/Throat:     Mouth: Mucous membranes are moist.  Eyes:     Conjunctiva/sclera: Conjunctivae normal.  Cardiovascular:     Rate and Rhythm: Normal rate.     Heart sounds: Normal heart sounds.  Pulmonary:     Effort: Pulmonary effort is normal.     Breath sounds: Normal breath sounds.  Musculoskeletal: Normal range of motion.  Skin:    General: Skin is warm and dry.  Neurological:     General: No focal deficit present.     Mental Status: She is alert.  Psychiatric:        Mood and Affect: Mood normal.      UC Treatments / Results  Labs (all labs ordered are listed, but only abnormal results are displayed) Labs Reviewed - No data to display  EKG None  Radiology No results found.  Procedures Procedures (including critical care time)  Medications Ordered in UC Medications - No data to display  Initial Impression / Assessment and Plan / UC Course  I have reviewed the triage vital signs and the nursing notes.  Pertinent labs & imaging results that were available during my care of the patient were reviewed by me and considered in my medical decision making (see chart for details).    Final Clinical Impressions(s) / UC Diagnoses   Final diagnoses:  Viral URI with cough   Discharge Instructions   None    ED Prescriptions  Medication Sig Dispense Auth. Provider   predniSONE (DELTASONE) 20 MG tablet Two daily with food 10 tablet  Robyn Haber, MD   HYDROcodone-homatropine (HYDROMET) 5-1.5 MG/5ML syrup Take 5 mLs by mouth every 6 (six) hours as needed for cough. 60 mL Robyn Haber, MD     Controlled Substance Prescriptions [place] Controlled Substance Registry consulted? Not Applicable   Robyn Haber, MD 11/20/18 1235

## 2018-11-20 NOTE — ED Triage Notes (Signed)
Started feeling bad one week ago.  Yesterday symptoms worsened.  Symptoms include body aches, sore throat , cough and sneezing, and chest tightness and laryngitis

## 2018-12-16 ENCOUNTER — Other Ambulatory Visit: Payer: Self-pay | Admitting: Obstetrics and Gynecology

## 2018-12-16 DIAGNOSIS — Z1231 Encounter for screening mammogram for malignant neoplasm of breast: Secondary | ICD-10-CM

## 2019-01-28 ENCOUNTER — Ambulatory Visit: Payer: 59

## 2019-02-06 ENCOUNTER — Ambulatory Visit
Admission: RE | Admit: 2019-02-06 | Discharge: 2019-02-06 | Disposition: A | Discharge status: Home / Self Care | Payer: 59 | Source: Ambulatory Visit | Attending: Obstetrics and Gynecology | Admitting: Obstetrics and Gynecology

## 2019-02-06 ENCOUNTER — Other Ambulatory Visit: Payer: Self-pay

## 2019-02-06 DIAGNOSIS — Z1231 Encounter for screening mammogram for malignant neoplasm of breast: Secondary | ICD-10-CM

## 2019-03-11 ENCOUNTER — Other Ambulatory Visit: Payer: Self-pay | Admitting: Obstetrics and Gynecology

## 2019-03-11 NOTE — Telephone Encounter (Signed)
Medication refill request: Yaz Last AEX:  02/14/18 Dr. Quincy Simmonds  Next AEX: none Last MMG (if hormonal medication request): 02/06/19  Refill authorized: 02/14/18 #4/4R. Today #4/1R?

## 2019-03-18 ENCOUNTER — Ambulatory Visit: Payer: 59 | Admitting: Obstetrics and Gynecology

## 2019-03-19 IMAGING — MG DIGITAL SCREENING BILATERAL MAMMOGRAM WITH TOMO AND CAD
8 series · 9 of 24 positions shown · non-contrast
Comparison: Previous exam(s).

CLINICAL DATA: Screening.

EXAM:
DIGITAL SCREENING BILATERAL MAMMOGRAM WITH TOMO AND CAD

[R CC synth-2D]
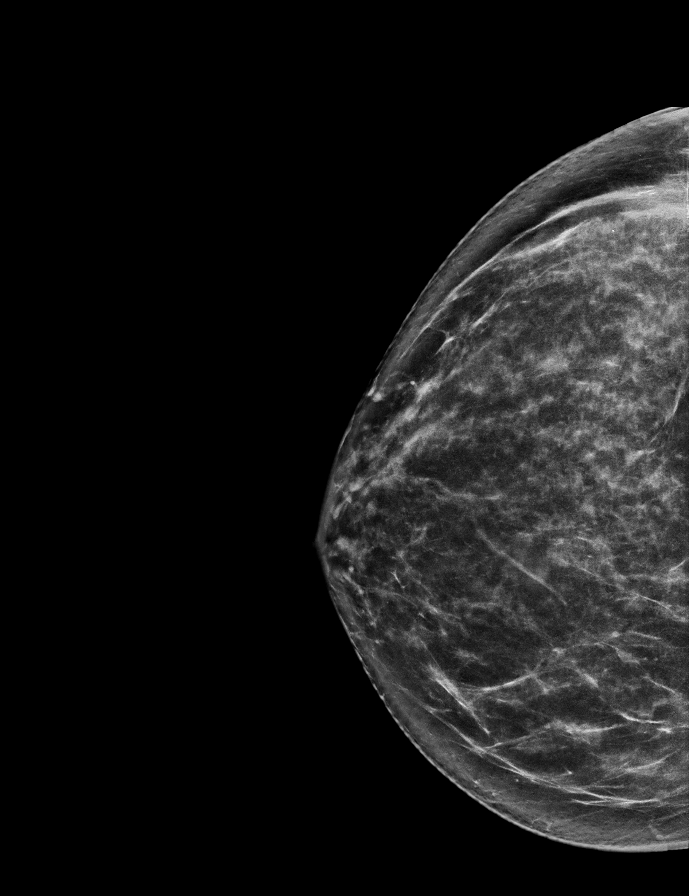

[L CC synth-2D]
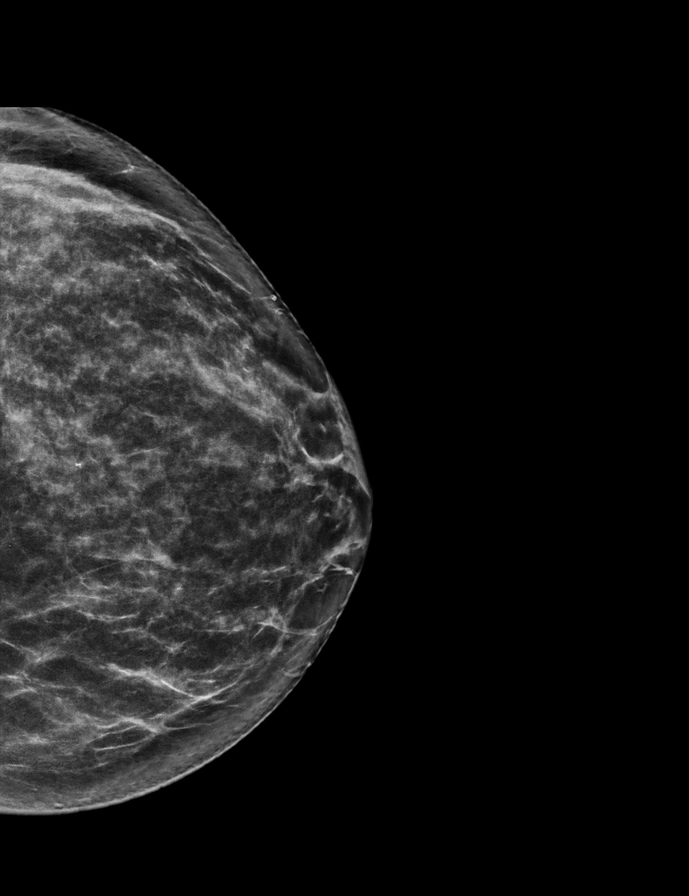

[L MLO synth-2D]
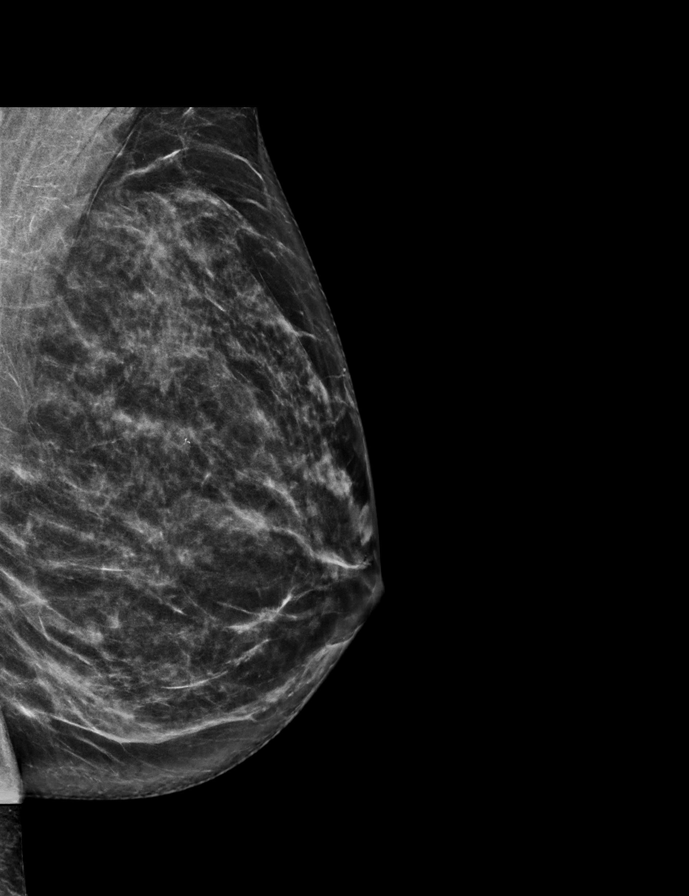

[R MLO synth-2D]
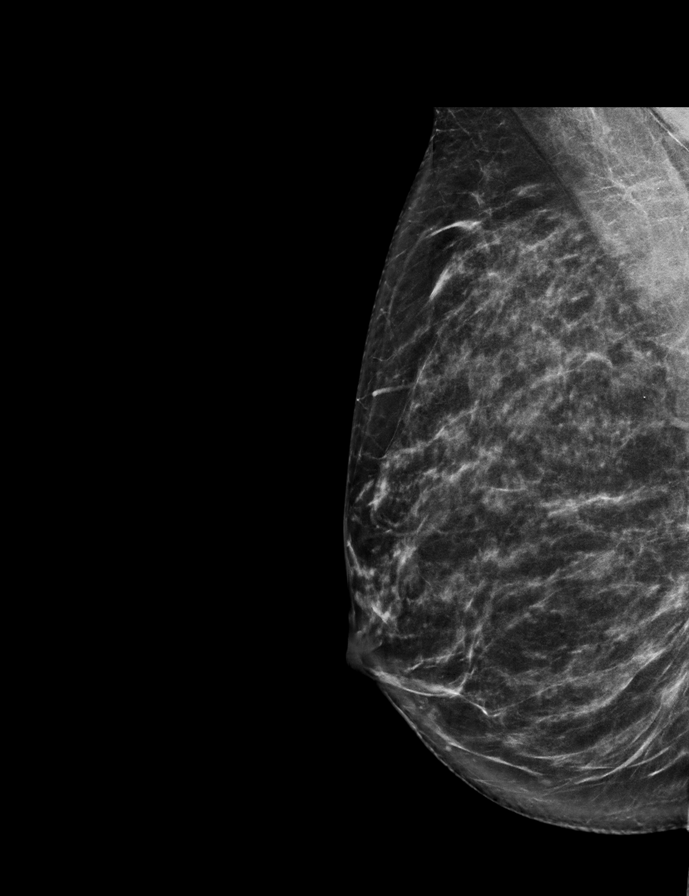

[R CC tomo · 2 of 69 frames shown]
[frame 23/69]
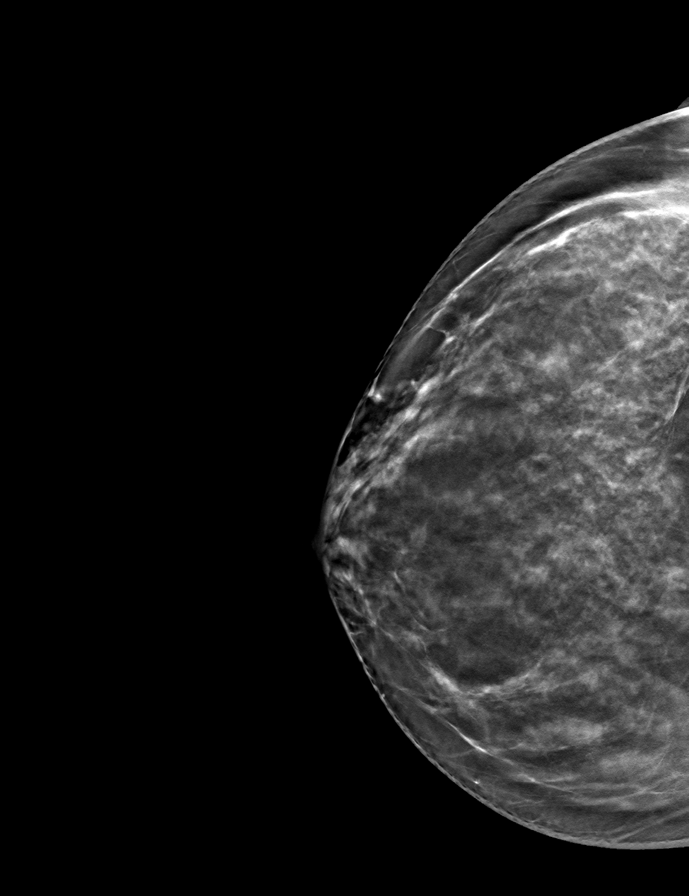
[frame 35/69]
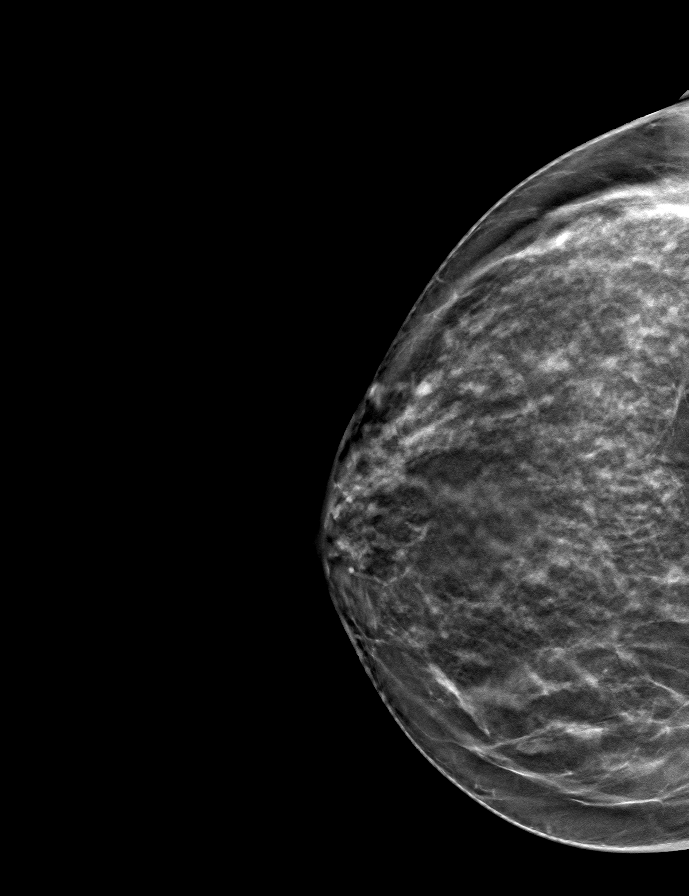

[R MLO tomo · tomo slice 37/73.0]
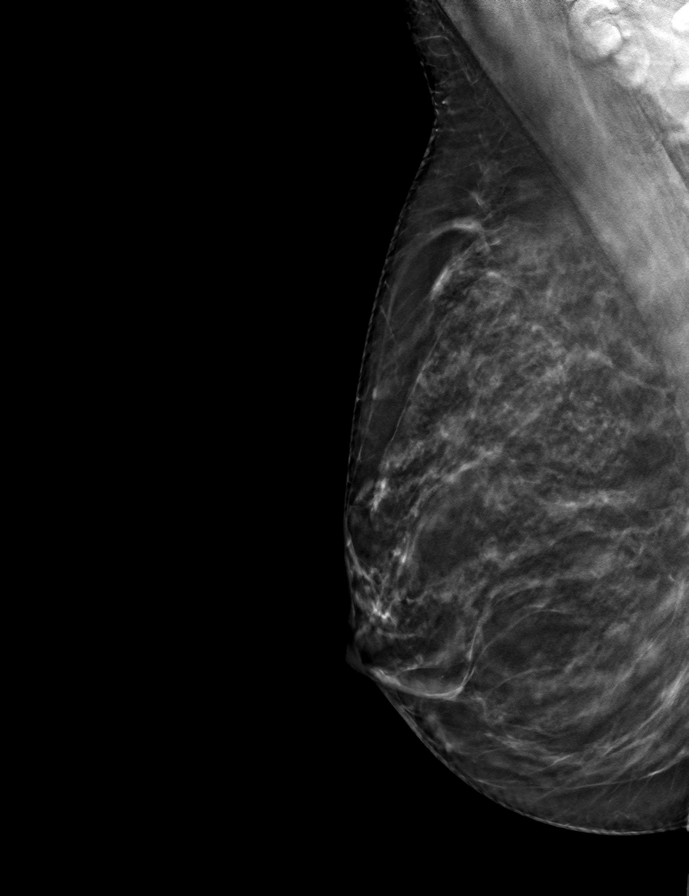

[L MLO tomo · tomo slice 35/68.0]
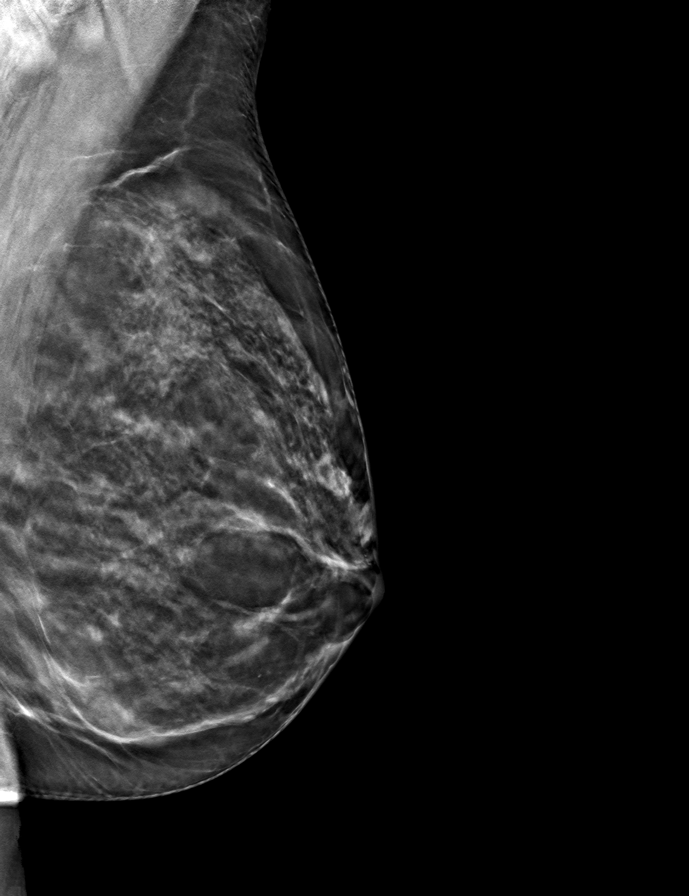

[L CC tomo · tomo slice 34/67.0]
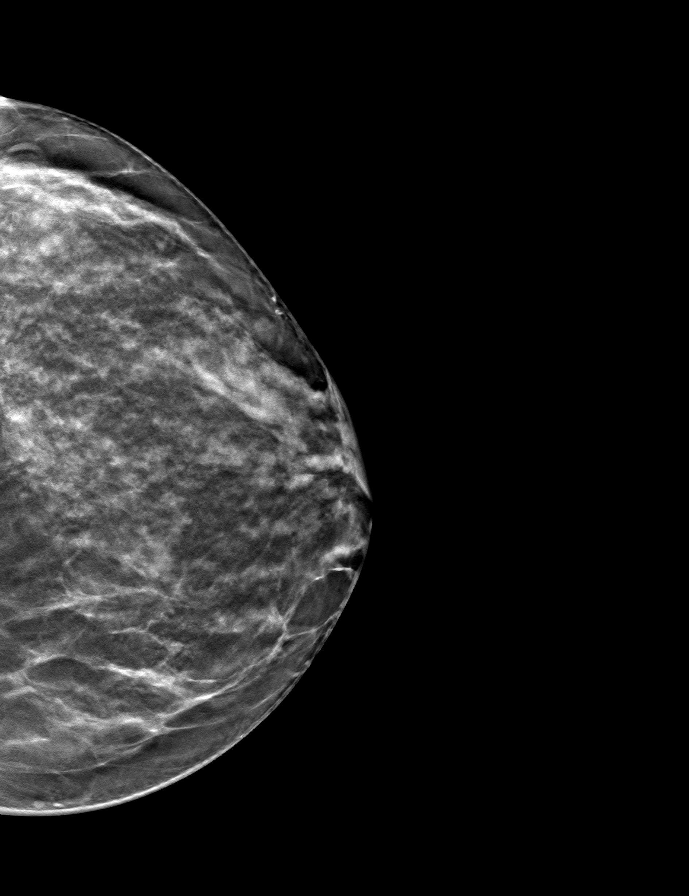

[9 of 24 positions shown; findings below may reference images not displayed]

ACR Breast Density Category b: There are scattered areas of
fibroglandular density.
FINDINGS: There are no findings suspicious for malignancy. Images were
processed with CAD.
IMPRESSION: No mammographic evidence of malignancy. A result letter of this
screening mammogram will be mailed directly to the patient.

RECOMMENDATION:
Screening mammogram in one year. (Code:[id number])

BI-RADS CATEGORY  1: Negative.

## 2019-06-02 ENCOUNTER — Other Ambulatory Visit: Payer: Self-pay | Admitting: Obstetrics and Gynecology

## 2019-06-02 NOTE — Telephone Encounter (Signed)
Medication refill request: Yaz  Last AEX:  02/14/18 Next AEX: 07/17/19 Last MMG (if hormonal medication request): 02/06/19 Bi-rads 1 neg  Refill authorized: #28 with 0rf

## 2019-06-10 ENCOUNTER — Encounter: Payer: Self-pay | Admitting: Family Medicine

## 2019-06-11 ENCOUNTER — Other Ambulatory Visit: Payer: Self-pay

## 2019-06-11 ENCOUNTER — Ambulatory Visit (HOSPITAL_COMMUNITY)
Admission: EM | Admit: 2019-06-11 | Discharge: 2019-06-11 | Disposition: A | Discharge status: Home / Self Care | Payer: 59 | Attending: Internal Medicine | Admitting: Internal Medicine

## 2019-06-11 ENCOUNTER — Encounter (HOSPITAL_COMMUNITY): Payer: Self-pay

## 2019-06-11 DIAGNOSIS — R300 Dysuria: Secondary | ICD-10-CM | POA: Diagnosis not present

## 2019-06-11 DIAGNOSIS — R350 Frequency of micturition: Secondary | ICD-10-CM

## 2019-06-11 LAB — POCT URINALYSIS DIP (DEVICE)
Bilirubin Urine: NEGATIVE
Glucose, UA: NEGATIVE mg/dL
Hgb urine dipstick: NEGATIVE
Ketones, ur: NEGATIVE mg/dL
Leukocytes,Ua: NEGATIVE
Nitrite: NEGATIVE
Protein, ur: NEGATIVE mg/dL
Specific Gravity, Urine: 1.01 (ref 1.005–1.030)
Urobilinogen, UA: 0.2 mg/dL (ref 0.0–1.0)
pH: 7 (ref 5.0–8.0)

## 2019-06-11 NOTE — ED Provider Notes (Signed)
[place]    CSN: [id number] Arrival date & time: 06/11/19  0946     History   Chief Complaint Chief Complaint  Patient presents with  . Appointment    [phone]  . Urinary Tract Infection    HPI Brittany Gillespie is a 43 y.o. female.   Patient is a 43 year old female with past medical history of abnormal Pap, allergy, anxiety, depression.  She presents today with approximately 2 days of pressure with urination, dysuria and frequency.  Symptoms have been constant.  Reporting history of urinary tract infection years ago.  She has not done anything to treat her symptoms.  Denies any associate abdominal pain, back pain, fevers, vaginal discharge, itching or irritation.  Uses oral contraceptives.  ROS per HPI      Past Medical History:  Diagnosis Date  . Abnormal Pap smear of cervix ~2005  . Allergy   . Anxiety   . Breast calcification, left 06/2014  . Depression   . PONV (postoperative nausea and vomiting)     Patient Active Problem List   Diagnosis Date Noted  . Knee pain, left 09/24/2016  . Insomnia 09/17/2016  . Mild depression ([place]) 09/17/2016  . Overweight 09/17/2016  . Breast calcification, left 07/22/2014    Past Surgical History:  Procedure Laterality Date  . ANTERIOR CRUCIATE LIGAMENT REPAIR Right 2003  . BREAST LUMPECTOMY WITH RADIOACTIVE SEED LOCALIZATION Left 08/04/2014   Procedure: RADIOACTIVE SEED AND WIRE LOCALIZATION LUMPECTOMY LEFT BREAST;  Surgeon: Autumn Messing III, MD;  Location: [place];  Service: General;  Laterality: Left;  . BREAST SURGERY    . CERVICAL BIOPSY  W/ LOOP ELECTRODE EXCISION  ~ 2005   [facility]   . tooth implant      OB History    Gravida  0   Para  0   Term  0   Preterm  0   AB  0   Living  0     SAB  0   TAB  0   Ectopic  0   Multiple  0   Live Births  0            Home Medications    Prior to Admission medications   Medication Sig Start Date End Date Taking?  Authorizing Provider  buPROPion [facility] SR) 150 MG 12 hr tablet Take twice a day 09/03/18   Copland, Gay Filler, MD  buPROPion (WELLBUTRIN SR) 150 MG 12 hr tablet TAKE 1 TABLET BY MOUTH TWICE DAILY 10/22/18   Copland, Gay Filler, MD  cetirizine (ZYRTEC) 10 MG tablet Take 10 mg by mouth daily as needed for allergies.    [provider]  drospirenone-ethinyl estradiol (YAZ) 3-0.02 MG tablet TAKE 1 TABLET BY MOUTH DAILY CONTINOUS. ACTIVE TABLETS ONLY 06/03/19   Yisroel Ramming, Everardo All, MD  ibuprofen (ADVIL,MOTRIN) 200 MG tablet Take 200 mg by mouth daily as needed.    [provider]    Family History Family History  Problem Relation Age of Onset  . Alzheimer's disease Maternal Grandmother   . Cancer Maternal Grandmother        breast cancer  . Breast cancer Maternal Grandmother   . Stroke Maternal Grandfather   . Heart disease Maternal Grandfather   . Glaucoma Paternal Grandmother   . Diabetes Paternal Grandfather   . Mental illness Father     Social History Social History   Tobacco Use  . Smoking status: Former Smoker  Types: Cigarettes    Quit date: 11/27/2011    Years since quitting: 7.5  . Smokeless tobacco: Never Used  Substance Use Topics  . Alcohol use: Yes    Alcohol/week: 0.0 standard drinks    Comment: 1 beer 3-4 weekly  . Drug use: Yes    Types: Marijuana    Comment: last used 1 week ago     Allergies   Patient has no known allergies.   Review of Systems Review of Systems   Physical Exam Triage Vital Signs ED Triage Vitals  Enc Vitals Group     BP 06/11/19 1013 (!) 132/95     Pulse Rate 06/11/19 1013 86     Resp 06/11/19 1013 18     Temp 06/11/19 1013 98.5 F (36.9 C)     Temp Source 06/11/19 1013 Oral     SpO2 06/11/19 1013 98 %     Weight 06/11/19 1011 175 lb (79.4 kg)     Height --      Head Circumference --      Peak Flow --      Pain Score 06/11/19 1011 2     Pain Loc --      Pain Edu? --      Excl. in [place]? --    No  data found.  Updated Vital Signs BP (!) 132/95 (BP Location: Right Arm)   Pulse 86   Temp 98.5 F (36.9 C) (Oral)   Resp 18   Wt 175 lb (79.4 kg)   SpO2 98%   BMI 30.04 kg/m   Visual Acuity Right Eye Distance:   Left Eye Distance:   Bilateral Distance:    Right Eye Near:   Left Eye Near:    Bilateral Near:     Physical Exam Vitals signs and nursing note reviewed.  Constitutional:      General: She is not in acute distress.    Appearance: Normal appearance. She is not ill-appearing, toxic-appearing or diaphoretic.  HENT:     Head: Normocephalic and atraumatic.     Nose: Nose normal.     Mouth/Throat:     Pharynx: Oropharynx is clear.  Eyes:     Conjunctiva/sclera: Conjunctivae normal.  Neck:     Musculoskeletal: Normal range of motion.  Pulmonary:     Effort: Pulmonary effort is normal.  Abdominal:     Palpations: Abdomen is soft.     Tenderness: There is no abdominal tenderness.  Musculoskeletal: Normal range of motion.  Skin:    General: Skin is warm and dry.     Findings: No rash.  Neurological:     Mental Status: She is alert.  Psychiatric:        Mood and Affect: Mood normal.      UC Treatments / Results  Labs (all labs ordered are listed, but only abnormal results are displayed) Labs Reviewed  POCT URINALYSIS DIP (DEVICE)    EKG   Radiology No results found.  Procedures Procedures (including critical care time)  Medications Ordered in UC Medications - No data to display  Initial Impression / Assessment and Plan / UC Course  I have reviewed the triage vital signs and the nursing notes.  Pertinent labs & imaging results that were available during my care of the patient were reviewed by me and considered in my medical decision making (see chart for details).     Urine negative for infection Instructed to push fluids and stay hydrated Monitor symptoms and follow-up with  OB/GYN for further management if symptoms continue. Information  given on dysuria and urinary frequency and other causes. Final Clinical Impressions(s) / UC Diagnoses   Final diagnoses:  None     Discharge Instructions     Your urine did not show any signs of infection Keep monitoring your symptoms.  Make sure you are drinking plenty of fluids If your symptoms continue you will need to follow-up with your OB/GYN    ED Prescriptions    None     Controlled Substance Prescriptions  Controlled Substance Registry consulted? Not Applicable   Orvan July, NP 06/11/19 1112

## 2019-06-11 NOTE — ED Triage Notes (Signed)
Pt states she has a pressure when voiding and burning as well.  X 2 days. Pt thinks she has a UTI.

## 2019-06-11 NOTE — Discharge Instructions (Addendum)
Your urine did not show any signs of infection Keep monitoring your symptoms.  Make sure you are drinking plenty of fluids If your symptoms continue you will need to follow-up with your OB/GYN

## 2019-06-18 ENCOUNTER — Other Ambulatory Visit: Payer: Self-pay | Admitting: Obstetrics and Gynecology

## 2019-06-18 ENCOUNTER — Telehealth: Payer: Self-pay | Admitting: Obstetrics and Gynecology

## 2019-06-18 NOTE — Telephone Encounter (Signed)
Patient will need refill on birth control before annual exam on 08/20/19. [address]

## 2019-06-18 NOTE — Telephone Encounter (Signed)
Left message on voicemail to call and reschedule cancelled appointment.

## 2019-06-18 NOTE — Telephone Encounter (Signed)
Medication refill request: OCP  Last AEX:  02-14-18 BS Next AEX: 08-20-2019 Last MMG (if hormonal medication request): n/a Refill authorized: Today, please advise.   Medication pended for #28, 1RF. Please refill if appropriate.

## 2019-06-19 MED ORDER — DROSPIRENONE-ETHINYL ESTRADIOL 3-0.02 MG PO TABS: 1.0000 | ORAL_TABLET | Freq: Every day | ORAL | 1 refills | Status: DC

## 2019-07-17 ENCOUNTER — Ambulatory Visit: Payer: 59 | Admitting: Obstetrics and Gynecology

## 2019-08-18 ENCOUNTER — Other Ambulatory Visit: Payer: Self-pay

## 2019-08-18 NOTE — Progress Notes (Signed)
43 y.o. G84P0000 Married Caucasian female here for annual exam.    Happy with taking her birth control continuously.   Will see her PCP next month.   Working from home during the pandemic.  PCP:   Lamar Blinks, MD   No LMP recorded. (Menstrual status: Oral contraceptives).           Sexually active: Yes.    The current method of family planning is OCP (estrogen/progesterone).    Exercising: Yes.    spin classes, walking Smoker:  Former smoker   Health Maintenance: Pap:  12-03-16 negative, HR HPV negative            07-05-14 negative, HR HPV negative            06/17/13 negative, HR HPV negative History of abnormal Pap:  yes MMG:  02-06-2019 density B/BIRADS 1 negative  Colonoscopy:  never BMD:   never  Result  n/a TDaP:  08-03-15 Gardasil:   no HIV: donated blood in the past  Hep C: donated blood in the past  Screening Labs:  Hb today: PCP, Urine today: not collected    reports that she quit smoking about 7 years ago. Her smoking use included cigarettes. She has never used smokeless tobacco. She reports current alcohol use. She reports current drug use. Drug: Marijuana.  Past Medical History:  Diagnosis Date  . Abnormal Pap smear of cervix ~2005  . Allergy   . Anxiety   . Breast calcification, left 06/2014  . Depression   . PONV (postoperative nausea and vomiting)     Past Surgical History:  Procedure Laterality Date  . ANTERIOR CRUCIATE LIGAMENT REPAIR Right 2003  . BREAST LUMPECTOMY WITH RADIOACTIVE SEED LOCALIZATION Left 08/04/2014   Procedure: RADIOACTIVE SEED AND WIRE LOCALIZATION LUMPECTOMY LEFT BREAST;  Surgeon: Autumn Messing III, MD;  Location: [place];  Service: General;  Laterality: Left;  . BREAST SURGERY    . CERVICAL BIOPSY  W/ LOOP ELECTRODE EXCISION  ~ 2005   [facility]   . tooth implant      Current Outpatient Medications  Medication Sig Dispense Refill  . buPROPion (WELLBUTRIN SR) 150 MG 12 hr tablet TAKE 1 TABLET BY MOUTH  TWICE DAILY 180 tablet 1  . cetirizine (ZYRTEC) 10 MG tablet Take 10 mg by mouth daily as needed for allergies.    . drospirenone-ethinyl estradiol (YAZ) 3-0.02 MG tablet Take 1 tablet by mouth daily. 28 tablet 1  . ibuprofen (ADVIL,MOTRIN) 200 MG tablet Take 200 mg by mouth daily as needed.     No current facility-administered medications for this visit.     Family History  Problem Relation Age of Onset  . Alzheimer's disease Maternal Grandmother   . Cancer Maternal Grandmother        breast cancer  . Breast cancer Maternal Grandmother   . Stroke Maternal Grandfather   . Heart disease Maternal Grandfather   . Glaucoma Paternal Grandmother   . Diabetes Paternal Grandfather   . Mental illness Father     Review of Systems  Constitutional: Negative.   HENT: Negative.   Eyes: Negative.   Respiratory: Negative.   Cardiovascular: Negative.   Gastrointestinal: Negative.   Endocrine: Negative.   Genitourinary: Negative.   Musculoskeletal: Negative.   Skin: Negative.   Allergic/Immunologic: Negative.   Neurological: Negative.   Hematological: Negative.   Psychiatric/Behavioral: Negative.     Exam:   BP 124/82 (BP Location: Right Arm, Patient Position: Sitting, Cuff Size: Normal)  Pulse 72   Temp 97.8 F (36.6 C) (Skin)   Resp 14   Ht 5' 4.25" (1.632 m)   Wt 179 lb 6.4 oz (81.4 kg)   BMI 30.55 kg/m     General appearance: alert, cooperative and appears stated age Head: normocephalic, without obvious abnormality, atraumatic Neck: no adenopathy, supple, symmetrical, trachea midline and thyroid normal to inspection and palpation Lungs: clear to auscultation bilaterally Breasts: normal appearance, no masses or tenderness, No nipple retraction or dimpling, No nipple discharge or bleeding, No axillary adenopathy Heart: regular rate and rhythm Abdomen: soft, non-tender; no masses, no organomegaly Extremities: extremities normal, atraumatic, no cyanosis or edema Skin: skin  color, texture, turgor normal. No rashes or lesions Lymph nodes: cervical, supraclavicular, and axillary nodes normal. Neurologic: grossly normal  Pelvic: External genitalia:  no lesions              No abnormal inguinal nodes palpated.              Urethra:  normal appearing urethra with no masses, tenderness or lesions              Bartholins and Skenes: normal                 Vagina: normal appearing vagina with normal color and discharge, no lesions              Cervix: no lesions.  Small amount of brown bloody mucous noted at os.               Pap taken: No. Bimanual Exam:  Uterus:  normal size, contour, position, consistency, mobility, non-tender              Adnexa: no mass, fullness, tenderness              Rectal exam: No. Declines.   Chaperone was present for exam.  Assessment:   Well woman visit with normal exam. Hx LEEP.   Plan: Mammogram screening discussed. Self breast awareness reviewed. Pap and HR HPV as above. Guidelines for Calcium, Vitamin D, regular exercise program including cardiovascular and weight bearing exercise.   Follow up annually and prn.   After visit summary provided.

## 2019-08-20 ENCOUNTER — Encounter: Payer: Self-pay | Admitting: Obstetrics and Gynecology

## 2019-08-20 ENCOUNTER — Other Ambulatory Visit: Payer: Self-pay

## 2019-08-20 ENCOUNTER — Ambulatory Visit: Payer: 59 | Admitting: Obstetrics and Gynecology

## 2019-08-20 VITALS — BP 124/82 | HR 72 | Temp 97.8°F | Resp 14 | Ht 64.25 in | Wt 179.4 lb

## 2019-08-20 DIAGNOSIS — Z01419 Encounter for gynecological examination (general) (routine) without abnormal findings: Secondary | ICD-10-CM | POA: Diagnosis not present

## 2019-08-20 MED ORDER — DROSPIRENONE-ETHINYL ESTRADIOL 3-0.02 MG PO TABS: 1.0000 | ORAL_TABLET | Freq: Every day | ORAL | 3 refills | Status: DC

## 2019-08-20 NOTE — Patient Instructions (Signed)

## 2019-09-07 ENCOUNTER — Encounter: Payer: 59 | Admitting: Family Medicine

## 2019-09-12 NOTE — Patient Instructions (Addendum)
It was great to see you again today, I will be in touch with your labs ASAP  We refilled your wellbutrin today I hope that your husband's hip surgery goes well!  We are going to check for anemia or any other lab abnormality today, will check your thyroid as well.   I will do a troponin for further assurance that your shortness of breath was not cardiac related.  We can do a D dimer test to screen for a pulmonary embolism if your symptoms return when you exercise tomorrow.  If you are NOT feeling well please seek immediate care    Health Maintenance, Female Adopting a healthy lifestyle and getting preventive care are important in promoting health and wellness. Ask your health care provider about:  The right schedule for you to have regular tests and exams.  Things you can do on your own to prevent diseases and keep yourself healthy. What should I know about diet, weight, and exercise? Eat a healthy diet   Eat a diet that includes plenty of vegetables, fruits, low-fat dairy products, and lean protein.  Do not eat a lot of foods that are high in solid fats, added sugars, or sodium. Maintain a healthy weight Body mass index (BMI) is used to identify weight problems. It estimates body fat based on height and weight. Your health care provider can help determine your BMI and help you achieve or maintain a healthy weight. Get regular exercise Get regular exercise. This is one of the most important things you can do for your health. Most adults should:  Exercise for at least 150 minutes each week. The exercise should increase your heart rate and make you sweat (moderate-intensity exercise).  Do strengthening exercises at least twice a week. This is in addition to the moderate-intensity exercise.  Spend less time sitting. Even light physical activity can be beneficial. Watch cholesterol and blood lipids Have your blood tested for lipids and cholesterol at 44 years of age, then have this test  every 5 years. Have your cholesterol levels checked more often if:  Your lipid or cholesterol levels are high.  You are older than 44 years of age.  You are at high risk for heart disease. What should I know about cancer screening? Depending on your health history and family history, you may need to have cancer screening at various ages. This may include screening for:  Breast cancer.  Cervical cancer.  Colorectal cancer.  Skin cancer.  Lung cancer. What should I know about heart disease, diabetes, and high blood pressure? Blood pressure and heart disease  High blood pressure causes heart disease and increases the risk of stroke. This is more likely to develop in people who have high blood pressure readings, are of African descent, or are overweight.  Have your blood pressure checked: ? Every 3-5 years if you are 89-25 years of age. ? Every year if you are 26 years old or older. Diabetes Have regular diabetes screenings. This checks your fasting blood sugar level. Have the screening done:  Once every three years after age 63 if you are at a normal weight and have a low risk for diabetes.  More often and at a younger age if you are overweight or have a high risk for diabetes. What should I know about preventing infection? Hepatitis B If you have a higher risk for hepatitis B, you should be screened for this virus. Talk with your health care provider to find out if you are at  risk for hepatitis B infection. Hepatitis C Testing is recommended for:  Everyone born from [birth year] through 1966.  Anyone with known risk factors for hepatitis C. Sexually transmitted infections (STIs)  Get screened for STIs, including gonorrhea and chlamydia, if: ? You are sexually active and are younger than 44 years of age. ? You are older than 44 years of age and your health care provider tells you that you are at risk for this type of infection. ? Your sexual activity has changed since you were  last screened, and you are at increased risk for chlamydia or gonorrhea. Ask your health care provider if you are at risk.  Ask your health care provider about whether you are at high risk for HIV. Your health care provider may recommend a prescription medicine to help prevent HIV infection. If you choose to take medicine to prevent HIV, you should first get tested for HIV. You should then be tested every 3 months for as long as you are taking the medicine. Pregnancy  If you are about to stop having your period (premenopausal) and you may become pregnant, seek counseling before you get pregnant.  Take 400 to 800 micrograms (mcg) of folic acid every day if you become pregnant.  Ask for birth control (contraception) if you want to prevent pregnancy. Osteoporosis and menopause Osteoporosis is a disease in which the bones lose minerals and strength with aging. This can result in bone fractures. If you are 78 years old or older, or if you are at risk for osteoporosis and fractures, ask your health care provider if you should:  Be screened for bone loss.  Take a calcium or vitamin D supplement to lower your risk of fractures.  Be given hormone replacement therapy (HRT) to treat symptoms of menopause. Follow these instructions at home: Lifestyle  Do not use any products that contain nicotine or tobacco, such as cigarettes, e-cigarettes, and chewing tobacco. If you need help quitting, ask your health care provider.  Do not use street drugs.  Do not share needles.  Ask your health care provider for help if you need support or information about quitting drugs. Alcohol use  Do not drink alcohol if: ? Your health care provider tells you not to drink. ? You are pregnant, may be pregnant, or are planning to become pregnant.  If you drink alcohol: ? Limit how much you use to 0-1 drink a day. ? Limit intake if you are breastfeeding.  Be aware of how much alcohol is in your drink. In the [place]  one drink equals one 12 oz bottle of beer (355 mL), one 5 oz glass of wine (148 mL), or one 1 oz glass of hard liquor (44 mL). General instructions  Schedule regular health, dental, and eye exams.  Stay current with your vaccines.  Tell your health care provider if: ? You often feel depressed. ? You have ever been abused or do not feel safe at home. Summary  Adopting a healthy lifestyle and getting preventive care are important in promoting health and wellness.  Follow your health care provider's instructions about healthy diet, exercising, and getting tested or screened for diseases.  Follow your health care provider's instructions on monitoring your cholesterol and blood pressure. This information is not intended to replace advice given to you by your health care provider. Make sure you discuss any questions you have with your health care provider. Document Released: 05/28/2011 Document Revised: 11/05/2018 Document Reviewed: 11/05/2018 Elsevier Patient Education  2020 Elsevier  Inc.  

## 2019-09-12 NOTE — Progress Notes (Addendum)
[facility] [address], [place], [place] [address] [phone]  Date:  09/16/2019   Name:  Brittany Gillespie   DOB:  06-11-1976   MRN:  [id number]  PCP:  Darreld Mclean, MD    Chief Complaint: Annual Exam   History of Present Illness:  Br[facility]any G[facility] is a 43 y.o. very pleasant female patient who presents with the following:  Generally healthy patient with history of overweight.  Here today for a routine physical She has been doing some outdoor spin classes for exercise during the pandemic She did a work-out this am- she felt SOB, like her heart was racing and like she was having a harder time than is normal for her  No wheezing No cough She now feels basically normal No chest pain noted  No history of DVT or PE She does not smoke No travel  She does take OCP  Last seen by myself for a physical about 1 year ago She works as an Veterinary surgeon for [employer].  At her last visit she had recently switched to a new job, and was much happier.  Her GYN care is through Dr. Larina Bras her well woman exam last month No children Married to [facility]- he has history of AVN and will have his 2nd hip replacement in a couple of weeks, she is a bit nervous about this   Pap up-to-date Mammogram up-to-date Flu shot done Tetanus up-to-date Otherwise labs about 1 year ago, can update today Patient Active Problem List   Diagnosis Date Noted  . Knee pain, left 09/24/2016  . Insomnia 09/17/2016  . Mild depression ([place]) 09/17/2016  . Overweight 09/17/2016  . Breast calcification, left 07/22/2014    Past Medical History:  Diagnosis Date  . Abnormal Pap smear of cervix ~2005  . Allergy   . Anxiety   . Breast calcification, left 06/2014  . Depression   . PONV (postoperative nausea and vomiting)     Past Surgical History:  Procedure Laterality Date  . ANTERIOR CRUCIATE LIGAMENT REPAIR Right 2003  . BREAST  LUMPECTOMY WITH RADIOACTIVE SEED LOCALIZATION Left 08/04/2014   Procedure: RADIOACTIVE SEED AND WIRE LOCALIZATION LUMPECTOMY LEFT BREAST;  Surgeon: Autumn Messing III, MD;  Location: [facility];  Service: General;  Laterality: Left;  . BREAST SURGERY    . CERVICAL BIOPSY  W/ LOOP ELECTRODE EXCISION  ~ 2005   [facility]   . tooth implant      Social History   Tobacco Use  . Smoking status: Former Smoker    Types: Cigarettes    Quit date: 11/27/2011    Years since quitting: 7.8  . Smokeless tobacco: Never Used  Substance Use Topics  . Alcohol use: Yes    Alcohol/week: 0.0 standard drinks    Comment: 1 beer 3-4 weekly  . Drug use: Yes    Types: Marijuana    Comment: last used 1 week ago    Family History  Problem Relation Age of Onset  . Alzheimer's disease Maternal Grandmother   . Cancer Maternal Grandmother        breast cancer  . Breast cancer Maternal Grandmother   . Stroke Maternal Grandfather   . Heart disease Maternal Grandfather   . Glaucoma Paternal Grandmother   . Diabetes Paternal Grandfather   . Mental illness Father     No Known Allergies  Medication list has been reviewed and updated.  Current  Outpatient Medications on File Prior to Visit  Medication Sig Dispense Refill  . buPROPion (WELLBUTRIN SR) 150 MG 12 hr tablet TAKE 1 TABLET BY MOUTH TWICE DAILY 180 tablet 1  . cetirizine (ZYRTEC) 10 MG tablet Take 10 mg by mouth daily as needed for allergies.    . drospirenone-ethinyl estradiol (YAZ) 3-0.02 MG tablet Take 1 tablet by mouth daily. Take pills continuously and skip the reminder pills at the end of the pack. 4 Package 3  . ibuprofen (ADVIL,MOTRIN) 200 MG tablet Take 200 mg by mouth daily as needed.     No current facility-administered medications on file prior to visit.     Review of Systems:  As per HPI- otherwise negative.   Physical Examination: Vitals:   09/16/19 0921  BP: 138/82  Pulse: 99  Resp: 18  Temp: (!) 97.3 F  (36.3 C)  SpO2: 99%   Vitals:   09/16/19 0921  Weight: 174 lb (78.9 kg)  Height: 5' 4.25" (1.632 m)   Body mass index is 29.64 kg/m. Ideal Body Weight: Weight in (lb) to have BMI = 25: 146.5  GEN: WDWN, NAD, Non-toxic, A & O x 3, overweight, looks well  HEENT: Atraumatic, Normocephalic. Neck supple. No masses, No LAD.  TM wnl bilaterally  Ears and Nose: No external deformity. CV: RRR, No M/G/R. No JVD. No thrill. No extra heart sounds. PULM: CTA B, no wheezes, crackles, rhonchi. No retractions. No resp. distress. No accessory muscle use. ABD: S, NT, ND, +BS. No rebound. No HSM. EXTR: No c/c/e No calf swelling or tenderness  NEURO Normal gait.  PSYCH: Normally interactive. Conversant. Not depressed or anxious appearing.  Calm demeanor.   Wt Readings from Last 3 Encounters:  09/16/19 174 lb (78.9 kg)  08/20/19 179 lb 6.4 oz (81.4 kg)  06/11/19 175 lb (79.4 kg)   Pulse Readings from Last 3 Encounters:  09/16/19 99  08/20/19 72  06/11/19 86   EKG: NSR, no previous for comparison Assessment and Plan: Physical exam  Screening for hyperlipidemia - Plan: Lipid panel  Screening for diabetes mellitus - Plan: Comprehensive metabolic panel, Hemoglobin A1c  Screening for deficiency anemia - Plan: CBC  Overweight  Screening for thyroid disorder - Plan: TSH  Palpitations - Plan: EKG 12-Lead, Troponin I (High Sensitivity), CANCELED: Troponin I (High Sensitivity)  Major depressive disorder, single episode, mild (HCC) - Plan: buPROPion (WELLBUTRIN SR) 150 MG 12 hr tablet  CPE today- labs pending as above Health maint is UTD She had an episode of SOB while exercising this am.  Now feeling well/ normal.  She is not sure if she was just anxious, or if her sx might have been triggered by very humid weather.  We discussed DDX, including CAD or PE.  CAD unlikely given her age, health factors, normal EKG.  Will check troponin.  Also discussed doing a D dimer - pt wishes to defer unless  her sx continue due to possible false positive which may lead to unnecessary CT angio  She is asked to keep me closely posted regarding her symptoms and to contact me after her exercise tomorrow with an update.  If sx return will plan for CXR and D dimer  If any distress or if not doing ok go to ER   Results for orders placed or performed in visit on 09/16/19  Troponin I (High Sensitivity)  Result Value Ref Range   High Sens Troponin I 10 2 - 17 ng/L   Message to pt- troponin  negative   Signed Lamar Blinks, MD Received her labs, message to pt Mild leukocytosis is new  As you know, your troponin was negative.   Thyroid in range Cholesterol looks good- excellent HDL! A1c (average blood sugar over the previous 3 months) in normal range Metabolic profile is ok  Blood counts ok- your white cell count is slightly high.  May be due to stress, not high enough to be alarming.  I would suggest that we recheck a CBC in about 3 months as a lab visit only- I will order this for you  Please let me know how you do at spin tomorrow  Results for orders placed or performed in visit on 09/16/19  CBC  Result Value Ref Range   WBC 11.9 (H) 4.0 - 10.5 K/uL   RBC 3.93 3.87 - 5.11 Mil/uL   Platelets 343.0 150.0 - 400.0 K/uL   Hemoglobin 11.7 (L) 12.0 - 15.0 g/dL   HCT 35.3 (L) 36.0 - 46.0 %   MCV 89.8 78.0 - 100.0 fl   MCHC 33.1 30.0 - 36.0 g/dL   RDW 13.2 11.5 - 15.5 %  Comprehensive metabolic panel  Result Value Ref Range   Sodium 134 (L) 135 - 145 mEq/L   Potassium 3.5 3.5 - 5.1 mEq/L   Chloride 99 96 - 112 mEq/L   CO2 25 19 - 32 mEq/L   Glucose, Bld 104 (H) 70 - 99 mg/dL   BUN 14 6 - 23 mg/dL   Creatinine, Ser 0.89 0.40 - 1.20 mg/dL   Total Bilirubin 0.5 0.2 - 1.2 mg/dL   Alkaline Phosphatase 56 39 - 117 U/L   AST 18 0 - 37 U/L   ALT 15 0 - 35 U/L   Total Protein 7.5 6.0 - 8.3 g/dL   Albumin 4.2 3.5 - 5.2 g/dL   Calcium 9.6 8.4 - 10.5 mg/dL   GFR 68.72 >60.00 mL/min  Hemoglobin  A1c  Result Value Ref Range   Hgb A1c MFr Bld 5.6 4.6 - 6.5 %  Lipid panel  Result Value Ref Range   Cholesterol 179 0 - 200 mg/dL   Triglycerides 85.0 0.0 - 149.0 mg/dL   HDL 91.90 >39.00 mg/dL   VLDL 17.0 0.0 - 40.0 mg/dL   LDL Cholesterol 70 0 - 99 mg/dL   Total CHOL/HDL Ratio 2    NonHDL 87.19   TSH  Result Value Ref Range   TSH 2.46 0.35 - 4.50 uIU/mL  Troponin I (High Sensitivity)  Result Value Ref Range   High Sens Troponin I 10 2 - 17 ng/L

## 2019-09-16 ENCOUNTER — Ambulatory Visit (INDEPENDENT_AMBULATORY_CARE_PROVIDER_SITE_OTHER): Payer: 59 | Admitting: Family Medicine

## 2019-09-16 ENCOUNTER — Other Ambulatory Visit: Payer: Self-pay

## 2019-09-16 ENCOUNTER — Encounter: Payer: Self-pay | Admitting: Family Medicine

## 2019-09-16 VITALS — BP 138/82 | HR 99 | Temp 97.3°F | Resp 18 | Ht 64.25 in | Wt 174.0 lb

## 2019-09-16 DIAGNOSIS — Z13220 Encounter for screening for lipoid disorders: Secondary | ICD-10-CM

## 2019-09-16 DIAGNOSIS — D72829 Elevated white blood cell count, unspecified: Secondary | ICD-10-CM

## 2019-09-16 DIAGNOSIS — Z131 Encounter for screening for diabetes mellitus: Secondary | ICD-10-CM | POA: Diagnosis not present

## 2019-09-16 DIAGNOSIS — Z130 Encounter for screening for diseases of the blood and blood-forming organs and certain disorders involving the immune mechanism: Secondary | ICD-10-CM | POA: Diagnosis not present

## 2019-09-16 DIAGNOSIS — Z0000 Encounter for general adult medical examination without abnormal findings: Secondary | ICD-10-CM | POA: Diagnosis not present

## 2019-09-16 DIAGNOSIS — F320 Major depressive disorder, single episode, mild: Secondary | ICD-10-CM

## 2019-09-16 DIAGNOSIS — R002 Palpitations: Secondary | ICD-10-CM | POA: Diagnosis not present

## 2019-09-16 DIAGNOSIS — Z1329 Encounter for screening for other suspected endocrine disorder: Secondary | ICD-10-CM | POA: Diagnosis not present

## 2019-09-16 DIAGNOSIS — E663 Overweight: Secondary | ICD-10-CM

## 2019-09-16 LAB — COMPREHENSIVE METABOLIC PANEL
ALT: 15 U/L (ref 0–35)
AST: 18 U/L (ref 0–37)
Albumin: 4.2 g/dL (ref 3.5–5.2)
Alkaline Phosphatase: 56 U/L (ref 39–117)
BUN: 14 mg/dL (ref 6–23)
CO2: 25 mEq/L (ref 19–32)
Calcium: 9.6 mg/dL (ref 8.4–10.5)
Chloride: 99 mEq/L (ref 96–112)
Creatinine, Ser: 0.89 mg/dL (ref 0.40–1.20)
GFR: 68.72 mL/min (ref >60.00)
Glucose, Bld: 104 mg/dL — ABNORMAL HIGH (ref 70–99)
Potassium: 3.5 mEq/L (ref 3.5–5.1)
Sodium: 134 mEq/L — ABNORMAL LOW (ref 135–145)
Total Bilirubin: 0.5 mg/dL (ref 0.2–1.2)
Total Protein: 7.5 g/dL (ref 6.0–8.3)

## 2019-09-16 LAB — CBC
HCT: 35.3 % — ABNORMAL LOW (ref 36.0–46.0)
Hemoglobin: 11.7 g/dL — ABNORMAL LOW (ref 12.0–15.0)
MCHC: 33.1 g/dL (ref 30.0–36.0)
MCV: 89.8 fl (ref 78.0–100.0)
Platelets: 343 10*3/uL (ref 150.0–400.0)
RBC: 3.93 Mil/uL (ref 3.87–5.11)
RDW: 13.2 % (ref 11.5–15.5)
WBC: 11.9 10*3/uL — ABNORMAL HIGH (ref 4.0–10.5)

## 2019-09-16 LAB — TSH: TSH: 2.46 u[IU]/mL (ref 0.35–4.50)

## 2019-09-16 LAB — LIPID PANEL
Cholesterol: 179 mg/dL (ref 0–200)
HDL: 91.9 mg/dL (ref >39.00)
LDL Cholesterol: 70 mg/dL (ref 0–99)
NonHDL: 87.19
Total CHOL/HDL Ratio: 2
Triglycerides: 85 mg/dL (ref 0.0–149.0)
VLDL: 17 mg/dL (ref 0.0–40.0)

## 2019-09-16 LAB — HEMOGLOBIN A1C: Hgb A1c MFr Bld: 5.6 % (ref 4.6–6.5)

## 2019-09-16 LAB — TROPONIN I (HIGH SENSITIVITY): High Sens Troponin I: 10 ng/L (ref 2–17)

## 2019-09-16 MED ORDER — BUPROPION HCL ER (SR) 150 MG PO TB12: ORAL_TABLET | ORAL | 3 refills | Status: DC

## 2019-09-16 NOTE — Addendum Note (Signed)
Addended by: Lamar Blinks C on: 09/16/2019 07:44 PM   Modules accepted: Orders

## 2020-02-15 ENCOUNTER — Other Ambulatory Visit: Payer: Self-pay | Admitting: Family Medicine

## 2020-02-15 DIAGNOSIS — Z1231 Encounter for screening mammogram for malignant neoplasm of breast: Secondary | ICD-10-CM

## 2020-02-29 ENCOUNTER — Ambulatory Visit
Admission: RE | Admit: 2020-02-29 | Discharge: 2020-02-29 | Disposition: A | Discharge status: Home / Self Care | Payer: 59 | Source: Ambulatory Visit

## 2020-02-29 ENCOUNTER — Other Ambulatory Visit: Payer: Self-pay

## 2020-02-29 DIAGNOSIS — Z1231 Encounter for screening mammogram for malignant neoplasm of breast: Secondary | ICD-10-CM

## 2020-08-23 ENCOUNTER — Other Ambulatory Visit: Payer: Self-pay

## 2020-08-23 ENCOUNTER — Ambulatory Visit: Payer: 59 | Admitting: Obstetrics and Gynecology

## 2020-08-23 ENCOUNTER — Encounter: Payer: Self-pay | Admitting: Obstetrics and Gynecology

## 2020-08-23 ENCOUNTER — Other Ambulatory Visit (HOSPITAL_COMMUNITY)
Admission: RE | Admit: 2020-08-23 | Discharge: 2020-08-23 | Disposition: A | Discharge status: Home / Self Care | Payer: 59 | Source: Ambulatory Visit | Attending: Obstetrics and Gynecology | Admitting: Obstetrics and Gynecology

## 2020-08-23 VITALS — BP 110/70 | HR 80 | Resp 18 | Ht 64.25 in | Wt 183.0 lb

## 2020-08-23 DIAGNOSIS — Z01419 Encounter for gynecological examination (general) (routine) without abnormal findings: Secondary | ICD-10-CM | POA: Diagnosis not present

## 2020-08-23 DIAGNOSIS — Z87410 Personal history of cervical dysplasia: Secondary | ICD-10-CM | POA: Diagnosis not present

## 2020-08-23 MED ORDER — DROSPIRENONE-ETHINYL ESTRADIOL 3-0.02 MG PO TABS: 1.0000 | ORAL_TABLET | Freq: Every day | ORAL | 3 refills | Status: DC

## 2020-08-23 MED ORDER — CLONAZEPAM 0.5 MG PO TABS: 0.5000 mg | ORAL_TABLET | Freq: Two times a day (BID) | ORAL | 1 refills | Status: DC | PRN

## 2020-08-23 NOTE — Progress Notes (Signed)
44 y.o. G6P0000 Married Caucasian female here for annual exam.   Likes taking her birth control pills continuously.  Has some breakthrough bleeding that is dark.  No pelvic pain.  No missed or late pills.   Is losing hair.  Hot at night.  Sleeplessness.  Used Ambien and Clonazepam in the past.   Not sexually active for 6 months.  Husband has pending ureteral surgery for a mass.   Stress currently.   Seeing a chiropractor for her scoliosis.  Working from home.   Covid vaccine completed.  Completed flu vaccine.   PCP:  Lamar Blinks, MD   No LMP recorded. (Menstrual status: Oral contraceptives).     Period Cycle (Days):  (takes OCPs continuously)     Sexually active: Yes.   Occ. The current method of family planning is OCP (estrogen/progesterone)--takes continuously   Exercising: Yes.    walking and spin class Smoker:  Former  Health Maintenance: Pap: 12-03-16 Neg:Neg HR HPV, 07-05-14 Neg:Neg HR HPV, 06-17-13 Neg:Neg HR HPV History of abnormal Pap:  Yes, 2005 Hx of LEEP MMG: 02-29-20  3D/Neg/density C/BiRads1 Colonoscopy:  n/a BMD:   n/a  Result  n/a TDaP:  08-03-15 Gardasil:   no HIV: donated blood in the past Hep C: donated blood in the past Screening Labs:  PCP.    reports that she quit smoking about 8 years ago. Her smoking use included cigarettes. She has never used smokeless tobacco. She reports current alcohol use of about 2.0 standard drinks of alcohol per week. She reports current drug use. Drug: Marijuana.  Past Medical History:  Diagnosis Date  . Abnormal Pap smear of cervix ~2005  . Allergy   . Anxiety   . Breast calcification, left 06/2014  . Depression   . PONV (postoperative nausea and vomiting)   . Scoliosis    since childhood    Past Surgical History:  Procedure Laterality Date  . ANTERIOR CRUCIATE LIGAMENT REPAIR Right 2003  . BREAST LUMPECTOMY WITH RADIOACTIVE SEED LOCALIZATION Left 08/04/2014   Procedure: RADIOACTIVE SEED AND WIRE LOCALIZATION  LUMPECTOMY LEFT BREAST;  Surgeon: Autumn Messing III, MD;  Location: [facility];  Service: General;  Laterality: Left;  . BREAST SURGERY    . CERVICAL BIOPSY  W/ LOOP ELECTRODE EXCISION  ~ 2005   [facility]   . tooth implant      Current Outpatient Medications  Medication Sig Dispense Refill  . buPROPion (WELLBUTRIN SR) 150 MG 12 hr tablet TAKE 1 TABLET BY MOUTH TWICE DAILY 180 tablet 3  . cetirizine (ZYRTEC) 10 MG tablet Take 10 mg by mouth daily as needed for allergies.    . drospirenone-ethinyl estradiol (YAZ) 3-0.02 MG tablet Take 1 tablet by mouth daily. Take pills continuously and skip the reminder pills at the end of the pack. 4 Package 3  . ibuprofen (ADVIL,MOTRIN) 200 MG tablet Take 200 mg by mouth daily as needed.     No current facility-administered medications for this visit.    Family History  Problem Relation Age of Onset  . Alzheimer's disease Maternal Grandmother   . Cancer Maternal Grandmother        breast cancer  . Breast cancer Maternal Grandmother   . Stroke Maternal Grandfather   . Heart disease Maternal Grandfather   . Glaucoma Paternal Grandmother   . Diabetes Paternal Grandfather   . Hypertension Mother   . Bradycardia Mother        Pacemaker inserted  . Mental illness Father  Review of Systems  All other systems reviewed and are negative.   Exam:   BP 110/70 (Cuff Size: Large)   Pulse 80   Resp 18   Ht 5' 4.25" (1.632 m)   Wt 183 lb (83 kg)   BMI 31.17 kg/m     General appearance: alert, cooperative and appears stated age.  Tearful at times. Head: normocephalic, without obvious abnormality, atraumatic Neck: no adenopathy, supple, symmetrical, trachea midline and thyroid normal to inspection and palpation Lungs: clear to auscultation bilaterally Breasts: normal appearance, no masses or tenderness, No nipple retraction or dimpling, No nipple discharge or bleeding, No axillary adenopathy Heart: regular rate and  rhythm Abdomen: soft, non-tender; no masses, no organomegaly Extremities: extremities normal, atraumatic, no cyanosis or edema Skin: skin color, texture, turgor normal. No rashes or lesions Lymph nodes: cervical, supraclavicular, and axillary nodes normal. Neurologic: grossly normal  Pelvic: External genitalia:  no lesions              No abnormal inguinal nodes palpated.              Urethra:  normal appearing urethra with no masses, tenderness or lesions              Bartholins and Skenes: normal                 Vagina: normal appearing vagina with normal color and discharge, no lesions              Cervix: no lesions              Pap taken: Yes.   Bimanual Exam:  Uterus:  normal size, contour, position, consistency, mobility, non-tender              Adnexa: no mass, fullness, tenderness              Rectal exam: Yes.  .  Confirms.              Anus:  normal sphincter tone, no lesions  Chaperone was present for exam.  Assessment:   Well woman visit with normal exam. Breakthrough bleeding on continuous birth control pills. Hx LEEP.  Situational stress.  Plan: Mammogram screening discussed. Self breast awareness reviewed. Pap and HR HPV as above. Guidelines for Calcium, Vitamin D, regular exercise program including cardiovascular and weight bearing exercise. She will contact her PCP about increasing her WellbutrinSR.  Clonazepam Rx.  We talked about employee assistance and increasing her circle of support. Follow up annually and prn.      After visit summary provided.

## 2020-08-23 NOTE — Patient Instructions (Signed)

## 2020-08-24 LAB — CYTOLOGY - PAP
Adequacy: ABSENT
Comment: NEGATIVE
Diagnosis: NEGATIVE
High risk HPV: NEGATIVE

## 2020-09-15 ENCOUNTER — Other Ambulatory Visit: Payer: Self-pay | Admitting: Obstetrics and Gynecology

## 2020-09-19 ENCOUNTER — Encounter: Payer: 59 | Admitting: Family Medicine

## 2020-10-23 NOTE — Progress Notes (Addendum)
[facility] at [facility] [place], [place], [place] [address] [phone]  Date:  10/31/2020   Name:  Brittany Gillespie   DOB:  1976-01-22   MRN:  [id number]  PCP:  Darreld Mclean, MD    Chief Complaint: Annual Exam   History of Present Illness:  Kirandeep Fariss is a 44 y.o. very pleasant female patient who presents with the following:  Patient here today for routine physical  Last seen by myself October 2020  She has GYN care with Dr Quincy Simmonds, most recent well woman exam was in September  She works as an Veterinary surgeon for [employer] Her husband is Doctor, general practice, they do not have children- her husband had cancer of the ureter and went through surgery earlier this year. He is doing well and was recently able to return to wokr   At her visit last year she had noted some shortness of breath while working out-evaluation was reassuring, no further issues with this   She is going to the gym and exercising again   Hepatitis C screening COVID-19 vaccine UTD including her booster yesterday  Flu shot- done  Mammogram and Pap are up-to-date Labs today- she is fasting today  She would like to do a cologaurd kit- no family history   She has noted more shedding of her hair recently  No bald patches, some overall thinning    Patient Active Problem List   Diagnosis Date Noted  . Knee pain, left 09/24/2016  . Insomnia 09/17/2016  . Mild depression ([place]) 09/17/2016  . Overweight 09/17/2016  . Breast calcification, left 07/22/2014    Past Medical History:  Diagnosis Date  . Abnormal Pap smear of cervix ~2005  . Allergy   . Anxiety   . Breast calcification, left 06/2014  . Depression   . PONV (postoperative nausea and vomiting)   . Scoliosis    since childhood    Past Surgical History:  Procedure Laterality Date  . ANTERIOR CRUCIATE LIGAMENT REPAIR Right 2003  . BREAST LUMPECTOMY WITH RADIOACTIVE SEED LOCALIZATION Left  08/04/2014   Procedure: RADIOACTIVE SEED AND WIRE LOCALIZATION LUMPECTOMY LEFT BREAST;  Surgeon: Autumn Messing III, MD;  Location: [facility];  Service: General;  Laterality: Left;  . BREAST SURGERY    . CERVICAL BIOPSY  W/ LOOP ELECTRODE EXCISION  ~ 2005   [facility]   . tooth implant      Social History   Tobacco Use  . Smoking status: Former Smoker    Types: Cigarettes    Quit date: 11/27/2011    Years since quitting: 8.9  . Smokeless tobacco: Never Used  Vaping Use  . Vaping Use: Never used  Substance Use Topics  . Alcohol use: Yes    Alcohol/week: 2.0 standard drinks    Types: 2 Glasses of wine per week  . Drug use: Yes    Types: Marijuana    Comment: last used 1 week ago    Family History  Problem Relation Age of Onset  . Alzheimer's disease Maternal Grandmother   . Cancer Maternal Grandmother        breast cancer  . Breast cancer Maternal Grandmother   . Stroke Maternal Grandfather   . Heart disease Maternal Grandfather   . Glaucoma Paternal Grandmother   . Diabetes Paternal Grandfather   . Hypertension Mother   . Bradycardia Mother        Pacemaker inserted  .  Mental illness Father     No Known Allergies  Medication list has been reviewed and updated.  Current Outpatient Medications on File Prior to Visit  Medication Sig Dispense Refill  . buPROPion (WELLBUTRIN SR) 150 MG 12 hr tablet TAKE 1 TABLET BY MOUTH TWICE DAILY 60 tablet 0  . clonazePAM (KLONOPIN) 0.5 MG tablet Take 1 tablet (0.5 mg total) by mouth 2 (two) times daily as needed (anxiety or difficulty sleeping). 30 tablet 1  . drospirenone-ethinyl estradiol (YAZ) 3-0.02 MG tablet Take 1 tablet by mouth daily. Take pills continuously and skip the reminder pills at the end of the pack. 112 tablet 3  . ibuprofen (ADVIL,MOTRIN) 200 MG tablet Take 200 mg by mouth daily as needed.    . loratadine (CLARITIN) 10 MG tablet Take 10 mg by mouth daily.     No current facility-administered  medications on file prior to visit.    Review of Systems:  As per HPI- otherwise negative.   Physical Examination: Vitals:   10/31/20 0903  BP: 126/90  Pulse: 74  Resp: 16  SpO2: 98%   Vitals:   10/31/20 0903  Weight: 186 lb (84.4 kg)  Height: 5' 4.25" (1.632 m)   Body mass index is 31.68 kg/m. Ideal Body Weight: Weight in (lb) to have BMI = 25: 146.5  GEN: no acute distress.  Overweight, looks well  HEENT: Atraumatic, Normocephalic.   Bilateral TM wnl, oropharynx normal.  PEERL,EOMI.   Hair appears normal, perhaps slight thinning at the hairline  Ears and Nose: No external deformity. CV: RRR, No M/G/R. No JVD. No thrill. No extra heart sounds. PULM: CTA B, no wheezes, crackles, rhonchi. No retractions. No resp. distress. No accessory muscle use. ABD: S, NT, ND, +BS. No rebound. No HSM. EXTR: No c/c/e PSYCH: Normally interactive. Conversant.    Assessment and Plan: Physical exam  Screening for hyperlipidemia - Plan: Lipid panel  Screening for diabetes mellitus - Plan: Comprehensive metabolic panel, Hemoglobin A1c  Screening for deficiency anemia - Plan: CBC  Screening for thyroid disorder - Plan: TSH  Encounter for hepatitis C screening test for low risk patient - Plan: Hepatitis C antibody  Fatigue, unspecified type - Plan: TSH, VITAMIN D 25 Hydroxy (Vit-D Deficiency, Fractures)  Hair loss - Plan: Ferritin  Major depressive disorder, single episode, mild (HCC) - Plan: buPROPion (WELLBUTRIN SR) 150 MG 12 hr tablet, clonazePAM (KLONOPIN) 0.5 MG tablet  Screening for colon cancer  Following up today for CPE Doing well overall She is exercising Concern about generally thinning hair- labs pending as above, may be TE due to husband's recent illness.  Can try Rogaine is needed  This visit occurred during the SARS-CoV-2 public health emergency.  Safety protocols were in place, including screening questions prior to the visit, additional usage of staff PPE, and  extensive cleaning of exam room while observing appropriate contact time as indicated for disinfecting solutions.    Signed Lamar Blinks, MD  Received her labs as below, message to patient  Results for orders placed or performed in visit on 10/31/20  CBC  Result Value Ref Range   WBC 8.9 4.0 - 10.5 K/uL   RBC 4.07 3.87 - 5.11 Mil/uL   Platelets 264.0 150 - 400 K/uL   Hemoglobin 12.3 12.0 - 15.0 g/dL   HCT 37.0 36 - 46 %   MCV 91.0 78.0 - 100.0 fl   MCHC 33.3 30.0 - 36.0 g/dL   RDW 12.9 11.5 - 15.5 %  Comprehensive metabolic  panel  Result Value Ref Range   Sodium 137 135 - 145 mEq/L   Potassium 4.2 3.5 - 5.1 mEq/L   Chloride 102 96 - 112 mEq/L   CO2 25 19 - 32 mEq/L   Glucose, Bld 88 70 - 99 mg/dL   BUN 9 6 - 23 mg/dL   Creatinine, Ser 0.67 0.40 - 1.20 mg/dL   Total Bilirubin 0.4 0.2 - 1.2 mg/dL   Alkaline Phosphatase 45 39 - 117 U/L   AST 15 0 - 37 U/L   ALT 13 0 - 35 U/L   Total Protein 6.7 6.0 - 8.3 g/dL   Albumin 3.9 3.5 - 5.2 g/dL   GFR 105.35 >60.00 mL/min   Calcium 9.0 8.4 - 10.5 mg/dL  Hemoglobin A1c  Result Value Ref Range   Hgb A1c MFr Bld 5.7 4.6 - 6.5 %  Lipid panel  Result Value Ref Range   Cholesterol 171 0 - 200 mg/dL   Triglycerides 99.0 0 - 149 mg/dL   HDL 85.60 >39.00 mg/dL   VLDL 19.8 0.0 - 40.0 mg/dL   LDL Cholesterol 66 0 - 99 mg/dL   Total CHOL/HDL Ratio 2    NonHDL 85.61   TSH  Result Value Ref Range   TSH 1.18 0.35 - 4.50 uIU/mL  VITAMIN D 25 Hydroxy (Vit-D Deficiency, Fractures)  Result Value Ref Range   VITD 30.04 30.00 - 100.00 ng/mL  Ferritin  Result Value Ref Range   Ferritin 32.7 10.0 - 291.0 ng/mL

## 2020-10-23 NOTE — Patient Instructions (Addendum)
It was great to see you again today, I will be in touch with your results as soon as possible We will send you a cologuard kit in the mail Refilled medications today- let me know if your stress is not under control We will check for any apparent cause of hair thinning- I suspect may be temporary stress related hair loss.  However certainly ok to start on topical Rogaine if you like!    Health Maintenance, Female Adopting a healthy lifestyle and getting preventive care are important in promoting health and wellness. Ask your health care provider about:  The right schedule for you to have regular tests and exams.  Things you can do on your own to prevent diseases and keep yourself healthy. What should I know about diet, weight, and exercise? Eat a healthy diet   Eat a diet that includes plenty of vegetables, fruits, low-fat dairy products, and lean protein.  Do not eat a lot of foods that are high in solid fats, added sugars, or sodium. Maintain a healthy weight Body mass index (BMI) is used to identify weight problems. It estimates body fat based on height and weight. Your health care provider can help determine your BMI and help you achieve or maintain a healthy weight. Get regular exercise Get regular exercise. This is one of the most important things you can do for your health. Most adults should:  Exercise for at least 150 minutes each week. The exercise should increase your heart rate and make you sweat (moderate-intensity exercise).  Do strengthening exercises at least twice a week. This is in addition to the moderate-intensity exercise.  Spend less time sitting. Even light physical activity can be beneficial. Watch cholesterol and blood lipids Have your blood tested for lipids and cholesterol at 45 years of age, then have this test every 5 years. Have your cholesterol levels checked more often if:  Your lipid or cholesterol levels are high.  You are older than 45 years of  age.  You are at high risk for heart disease. What should I know about cancer screening? Depending on your health history and family history, you may need to have cancer screening at various ages. This may include screening for:  Breast cancer.  Cervical cancer.  Colorectal cancer.  Skin cancer.  Lung cancer. What should I know about heart disease, diabetes, and high blood pressure? Blood pressure and heart disease  High blood pressure causes heart disease and increases the risk of stroke. This is more likely to develop in people who have high blood pressure readings, are of African descent, or are overweight.  Have your blood pressure checked: ? Every 3-5 years if you are 62-70 years of age. ? Every year if you are 62 years old or older. Diabetes Have regular diabetes screenings. This checks your fasting blood sugar level. Have the screening done:  Once every three years after age 61 if you are at a normal weight and have a low risk for diabetes.  More often and at a younger age if you are overweight or have a high risk for diabetes. What should I know about preventing infection? Hepatitis B If you have a higher risk for hepatitis B, you should be screened for this virus. Talk with your health care provider to find out if you are at risk for hepatitis B infection. Hepatitis C Testing is recommended for:  Everyone born from [birth year] through 1966.  Anyone with known risk factors for hepatitis C. Sexually transmitted infections (  STIs)  Get screened for STIs, including gonorrhea and chlamydia, if: ? You are sexually active and are younger than 45 years of age. ? You are older than 45 years of age and your health care provider tells you that you are at risk for this type of infection. ? Your sexual activity has changed since you were last screened, and you are at increased risk for chlamydia or gonorrhea. Ask your health care provider if you are at risk.  Ask your health care  provider about whether you are at high risk for HIV. Your health care provider may recommend a prescription medicine to help prevent HIV infection. If you choose to take medicine to prevent HIV, you should first get tested for HIV. You should then be tested every 3 months for as long as you are taking the medicine. Pregnancy  If you are about to stop having your period (premenopausal) and you may become pregnant, seek counseling before you get pregnant.  Take 400 to 800 micrograms (mcg) of folic acid every day if you become pregnant.  Ask for birth control (contraception) if you want to prevent pregnancy. Osteoporosis and menopause Osteoporosis is a disease in which the bones lose minerals and strength with aging. This can result in bone fractures. If you are 5 years old or older, or if you are at risk for osteoporosis and fractures, ask your health care provider if you should:  Be screened for bone loss.  Take a calcium or vitamin D supplement to lower your risk of fractures.  Be given hormone replacement therapy (HRT) to treat symptoms of menopause. Follow these instructions at home: Lifestyle  Do not use any products that contain nicotine or tobacco, such as cigarettes, e-cigarettes, and chewing tobacco. If you need help quitting, ask your health care provider.  Do not use street drugs.  Do not share needles.  Ask your health care provider for help if you need support or information about quitting drugs. Alcohol use  Do not drink alcohol if: ? Your health care provider tells you not to drink. ? You are pregnant, may be pregnant, or are planning to become pregnant.  If you drink alcohol: ? Limit how much you use to 0-1 drink a day. ? Limit intake if you are breastfeeding.  Be aware of how much alcohol is in your drink. In the [place] one drink equals one 12 oz bottle of beer (355 mL), one 5 oz glass of wine (148 mL), or one 1 oz glass of hard liquor (44 mL). General  instructions  Schedule regular health, dental, and eye exams.  Stay current with your vaccines.  Tell your health care provider if: ? You often feel depressed. ? You have ever been abused or do not feel safe at home. Summary  Adopting a healthy lifestyle and getting preventive care are important in promoting health and wellness.  Follow your health care provider's instructions about healthy diet, exercising, and getting tested or screened for diseases.  Follow your health care provider's instructions on monitoring your cholesterol and blood pressure. This information is not intended to replace advice given to you by your health care provider. Make sure you discuss any questions you have with your health care provider. Document Revised: 11/05/2018 Document Reviewed: 11/05/2018 Elsevier Patient Education  2020 [employer].

## 2020-10-27 ENCOUNTER — Other Ambulatory Visit: Payer: Self-pay | Admitting: Family Medicine

## 2020-10-27 DIAGNOSIS — F320 Major depressive disorder, single episode, mild: Secondary | ICD-10-CM

## 2020-10-31 ENCOUNTER — Other Ambulatory Visit: Payer: Self-pay

## 2020-10-31 ENCOUNTER — Encounter: Payer: Self-pay | Admitting: Family Medicine

## 2020-10-31 ENCOUNTER — Ambulatory Visit (INDEPENDENT_AMBULATORY_CARE_PROVIDER_SITE_OTHER): Payer: 59 | Admitting: Family Medicine

## 2020-10-31 VITALS — BP 126/90 | HR 74 | Resp 16 | Ht 64.25 in | Wt 186.0 lb

## 2020-10-31 DIAGNOSIS — Z1211 Encounter for screening for malignant neoplasm of colon: Secondary | ICD-10-CM

## 2020-10-31 DIAGNOSIS — L659 Nonscarring hair loss, unspecified: Secondary | ICD-10-CM

## 2020-10-31 DIAGNOSIS — F320 Major depressive disorder, single episode, mild: Secondary | ICD-10-CM

## 2020-10-31 DIAGNOSIS — Z1329 Encounter for screening for other suspected endocrine disorder: Secondary | ICD-10-CM

## 2020-10-31 DIAGNOSIS — Z0000 Encounter for general adult medical examination without abnormal findings: Secondary | ICD-10-CM | POA: Diagnosis not present

## 2020-10-31 DIAGNOSIS — R5383 Other fatigue: Secondary | ICD-10-CM | POA: Diagnosis not present

## 2020-10-31 DIAGNOSIS — Z131 Encounter for screening for diabetes mellitus: Secondary | ICD-10-CM

## 2020-10-31 DIAGNOSIS — Z13220 Encounter for screening for lipoid disorders: Secondary | ICD-10-CM

## 2020-10-31 DIAGNOSIS — Z1159 Encounter for screening for other viral diseases: Secondary | ICD-10-CM

## 2020-10-31 DIAGNOSIS — Z130 Encounter for screening for diseases of the blood and blood-forming organs and certain disorders involving the immune mechanism: Secondary | ICD-10-CM

## 2020-10-31 LAB — COMPREHENSIVE METABOLIC PANEL
ALT: 13 U/L (ref 0–35)
AST: 15 U/L (ref 0–37)
Albumin: 3.9 g/dL (ref 3.5–5.2)
Alkaline Phosphatase: 45 U/L (ref 39–117)
BUN: 9 mg/dL (ref 6–23)
CO2: 25 mEq/L (ref 19–32)
Calcium: 9 mg/dL (ref 8.4–10.5)
Chloride: 102 mEq/L (ref 96–112)
Creatinine, Ser: 0.67 mg/dL (ref 0.40–1.20)
GFR: 105.35 mL/min (ref >60.00)
Glucose, Bld: 88 mg/dL (ref 70–99)
Potassium: 4.2 mEq/L (ref 3.5–5.1)
Sodium: 137 mEq/L (ref 135–145)
Total Bilirubin: 0.4 mg/dL (ref 0.2–1.2)
Total Protein: 6.7 g/dL (ref 6.0–8.3)

## 2020-10-31 LAB — CBC
HCT: 37 % (ref 36.0–46.0)
Hemoglobin: 12.3 g/dL (ref 12.0–15.0)
MCHC: 33.3 g/dL (ref 30.0–36.0)
MCV: 91 fl (ref 78.0–100.0)
Platelets: 264 10*3/uL (ref 150.0–400.0)
RBC: 4.07 Mil/uL (ref 3.87–5.11)
RDW: 12.9 % (ref 11.5–15.5)
WBC: 8.9 10*3/uL (ref 4.0–10.5)

## 2020-10-31 LAB — LIPID PANEL
Cholesterol: 171 mg/dL (ref 0–200)
HDL: 85.6 mg/dL (ref >39.00)
LDL Cholesterol: 66 mg/dL (ref 0–99)
NonHDL: 85.61
Total CHOL/HDL Ratio: 2
Triglycerides: 99 mg/dL (ref 0.0–149.0)
VLDL: 19.8 mg/dL (ref 0.0–40.0)

## 2020-10-31 LAB — VITAMIN D 25 HYDROXY (VIT D DEFICIENCY, FRACTURES): VITD: 30.04 ng/mL (ref 30.00–100.00)

## 2020-10-31 LAB — TSH: TSH: 1.18 u[IU]/mL (ref 0.35–4.50)

## 2020-10-31 LAB — FERRITIN: Ferritin: 32.7 ng/mL (ref 10.0–291.0)

## 2020-10-31 LAB — HEMOGLOBIN A1C: Hgb A1c MFr Bld: 5.7 % (ref 4.6–6.5)

## 2020-10-31 MED ORDER — CLONAZEPAM 0.5 MG PO TABS: 0.5000 mg | ORAL_TABLET | Freq: Two times a day (BID) | ORAL | 1 refills | Status: DC | PRN

## 2020-10-31 MED ORDER — BUPROPION HCL ER (SR) 150 MG PO TB12: 150.0000 mg | ORAL_TABLET | Freq: Two times a day (BID) | ORAL | 3 refills | Status: DC

## 2020-10-31 NOTE — Progress Notes (Signed)
Cologuard ordered for patient.

## 2020-11-01 LAB — HEPATITIS C ANTIBODY
Hepatitis C Ab: NONREACTIVE
SIGNAL TO CUT-OFF: 0.01 (ref <1.00)

## 2021-02-20 ENCOUNTER — Other Ambulatory Visit: Payer: Self-pay | Admitting: Family Medicine

## 2021-02-20 DIAGNOSIS — Z1231 Encounter for screening mammogram for malignant neoplasm of breast: Secondary | ICD-10-CM

## 2021-02-27 ENCOUNTER — Other Ambulatory Visit: Payer: Self-pay | Admitting: Family Medicine

## 2021-02-27 DIAGNOSIS — F320 Major depressive disorder, single episode, mild: Secondary | ICD-10-CM

## 2021-03-10 ENCOUNTER — Encounter (HOSPITAL_COMMUNITY): Payer: Self-pay

## 2021-03-10 ENCOUNTER — Other Ambulatory Visit: Payer: Self-pay

## 2021-03-10 ENCOUNTER — Ambulatory Visit (HOSPITAL_COMMUNITY)
Admission: RE | Admit: 2021-03-10 | Discharge: 2021-03-10 | Disposition: A | Discharge status: Home / Self Care | Payer: 59 | Source: Ambulatory Visit | Attending: Physician Assistant | Admitting: Physician Assistant

## 2021-03-10 VITALS — BP 132/101 | HR 83 | Temp 98.8°F | Resp 18

## 2021-03-10 DIAGNOSIS — J00 Acute nasopharyngitis [common cold]: Secondary | ICD-10-CM | POA: Diagnosis not present

## 2021-03-10 MED ORDER — BENZONATATE 100 MG PO CAPS: 100.0000 mg | ORAL_CAPSULE | Freq: Three times a day (TID) | ORAL | 0 refills | Status: DC | PRN

## 2021-03-10 MED ORDER — AMOXICILLIN-POT CLAVULANATE 875-125 MG PO TABS: 1.0000 | ORAL_TABLET | Freq: Two times a day (BID) | ORAL | 0 refills | Status: AC

## 2021-03-10 NOTE — Discharge Instructions (Signed)
Recommend Flonase along with allergy medication (Claritin) Push fluids Take ibuprofen as needed for headache

## 2021-03-10 NOTE — ED Provider Notes (Addendum)
[facility]    CSN: [id number] Arrival date & time: 03/10/21  0843      History   Chief Complaint Chief Complaint  Patient presents with  . Cough  . Fever  . Headache  . Nasal Congestion    HPI Brittany Gillespie is a 45 y.o. female.   Pt complains of congestion, sinus pressure, and cough that started about 5 days ago.  She reports fevers at home which have now resolved.  She is taking Claritin.  She is vaccinated against COVID.  Denies sick contacts. Pt took a home COVID test Wednesday of this week which was negative.      Past Medical History:  Diagnosis Date  . Abnormal Pap smear of cervix ~2005  . Allergy   . Anxiety   . Breast calcification, left 06/2014  . Depression   . PONV (postoperative nausea and vomiting)   . Scoliosis    since childhood    Patient Active Problem List   Diagnosis Date Noted  . Knee pain, left 09/24/2016  . Insomnia 09/17/2016  . 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 Overweight 09/17/2016  . Breast calcification, left 07/22/2014    Past Surgical History:  Procedure Laterality Date  . ANTERIOR CRUCIATE LIGAMENT REPAIR Right 2003  . BREAST LUMPECTOMY WITH RADIOACTIVE SEED LOCALIZATION Left 08/04/2014   Procedure: RADIOACTIVE SEED AND WIRE LOCALIZATION LUMPECTOMY LEFT BREAST;  Surgeon: Autumn Messing III, MD;  Location: [facility];  Service: General;  Laterality: Left;  . BREAST SURGERY    . CERVICAL BIOPSY  W/ LOOP ELECTRODE EXCISION  ~ 2005   [facility]   . tooth implant      OB History    Gravida  0   Para  0   Term  0   Preterm  0   AB  0   Living  0     SAB  0   IAB  0   Ectopic  0   Multiple  0   Live Births  0            Home Medications    Prior to Admission medications   Medication Sig Start Date End Date Taking? Authorizing Provider  buPROPion (WELLBUTRIN SR) 150 MG 12 hr tablet Take 1 tablet (150 mg total) by mouth 2 (two) times daily. 10/31/20   Copland, Gay Filler, MD   clonazePAM (KLONOPIN) 0.5 MG tablet TAKE 1 TABLET(0.5 MG) BY MOUTH TWICE DAILY AS NEEDED FOR ANXIETY OR DIFFICULTY SLEEPING 02/27/21   Copland, Gay Filler, MD  drospirenone-ethinyl estradiol (YAZ) 3-0.02 MG tablet Take 1 tablet by mouth daily. Take pills continuously and skip the reminder pills at the end of the pack. 08/23/20   Nunzio Cobbs, MD  ibuprofen (ADVIL,MOTRIN) 200 MG tablet Take 200 mg by mouth daily as needed.    [provider]  loratadine (CLARITIN) 10 MG tablet Take 10 mg by mouth daily.    [provider]    Family History Family History  Problem Relation Age of Onset  . Alzheimer's disease Maternal Grandmother   . Cancer Maternal Grandmother        breast cancer  . Breast cancer Maternal Grandmother   . Stroke Maternal Grandfather   . Heart disease Maternal Grandfather   . Glaucoma Paternal Grandmother   . Diabetes Paternal Grandfather   . Hypertension Mother   . Bradycardia Mother        Pacemaker inserted  . Mental  illness Father     Social History Social History   Tobacco Use  . Smoking status: Former Smoker    Types: Cigarettes    Quit date: 11/27/2011    Years since quitting: 9.2  . Smokeless tobacco: Never Used  Vaping Use  . Vaping Use: Never used  Substance Use Topics  . Alcohol use: Yes    Alcohol/week: 2.0 standard drinks    Types: 2 Glasses of wine per week  . Drug use: Yes    Types: Marijuana    Comment: last used 1 week ago     Allergies   Patient has no known allergies.   Review of Systems Review of Systems  Constitutional: Negative for chills and fever.  HENT: Positive for congestion and sinus pressure. Negative for ear pain and sore throat.   Eyes: Negative for pain and visual disturbance.  Respiratory: Positive for cough. Negative for shortness of breath.   Cardiovascular: Negative for chest pain and palpitations.  Gastrointestinal: Negative for abdominal pain and vomiting.  Genitourinary: Negative  for dysuria and hematuria.  Musculoskeletal: Negative for arthralgias and back pain.  Skin: Negative for color change and rash.  Neurological: Negative for seizures and syncope.  All other systems reviewed and are negative.    Physical Exam Triage Vital Signs ED Triage Vitals  Enc Vitals Group     BP 03/10/21 0917 (!) 132/101     Pulse Rate 03/10/21 0917 83     Resp 03/10/21 0917 18     Temp 03/10/21 0917 98.8 F (37.1 C)     Temp Source 03/10/21 0917 Oral     SpO2 03/10/21 0917 98 %     Weight --      Height --      Head Circumference --      Peak Flow --      Pain Score 03/10/21 0916 0     Pain Loc --      Pain Edu? --      Excl. in [place]? --    No data found.  Updated Vital Signs BP (!) 132/101 (BP Location: Right Arm)   Pulse 83   Temp 98.8 F (37.1 C) (Oral)   Resp 18   SpO2 98%   Visual Acuity Right Eye Distance:   Left Eye Distance:   Bilateral Distance:    Right Eye Near:   Left Eye Near:    Bilateral Near:     Physical Exam Vitals and nursing note reviewed.  Constitutional:      General: She is not in acute distress.    Appearance: She is well-developed.  HENT:     Head: Normocephalic and atraumatic.  Eyes:     Conjunctiva/sclera: Conjunctivae normal.  Cardiovascular:     Rate and Rhythm: Normal rate and regular rhythm.     Heart sounds: No murmur heard.   Pulmonary:     Effort: Pulmonary effort is normal. No respiratory distress.     Breath sounds: Normal breath sounds.  Abdominal:     Palpations: Abdomen is soft.     Tenderness: There is no abdominal tenderness.  Musculoskeletal:     Cervical back: Neck supple.  Skin:    General: Skin is warm and dry.  Neurological:     Mental Status: She is alert.      UC Treatments / Results  Labs (all labs ordered are listed, but only abnormal results are displayed) Labs Reviewed - No data to display  EKG  Radiology No results found.  Procedures Procedures (including critical care  time)  Medications Ordered in UC Medications - No data to display  Initial Impression / Assessment and Plan / UC Course  I have reviewed the triage vital signs and the nursing notes.  Pertinent labs & imaging results that were available during my care of the patient were reviewed by me and considered in my medical decision making (see chart for details).    Sinusitis. Pt is traveling soon to [place]. Will send in an antibiotic for her to take if sx persists after 7-10 days.  Pt agrees with plan. She will begin flonase.   Final Clinical Impressions(s) / UC Diagnoses   Final diagnoses:  None   Discharge Instructions   None    ED Prescriptions    None     PDMP not reviewed this encounter.   Konrad Felix, PA-C 03/10/21 1025    Konrad Felix, PA-C 03/10/21 1027

## 2021-03-10 NOTE — ED Triage Notes (Signed)
Pt states that she has a cough, nasal congestion, fever(100.4), and HA. Pt states that her sx started Monday.

## 2021-04-06 ENCOUNTER — Ambulatory Visit
Admission: RE | Admit: 2021-04-06 | Discharge: 2021-04-06 | Disposition: A | Discharge status: Home / Self Care | Payer: 59 | Source: Ambulatory Visit

## 2021-04-06 ENCOUNTER — Other Ambulatory Visit: Payer: Self-pay

## 2021-04-06 DIAGNOSIS — Z1231 Encounter for screening mammogram for malignant neoplasm of breast: Secondary | ICD-10-CM

## 2021-04-27 ENCOUNTER — Other Ambulatory Visit: Payer: Self-pay

## 2021-04-27 ENCOUNTER — Encounter (HOSPITAL_COMMUNITY): Payer: Self-pay

## 2021-04-27 ENCOUNTER — Ambulatory Visit (HOSPITAL_COMMUNITY)
Admission: EM | Admit: 2021-04-27 | Discharge: 2021-04-27 | Disposition: A | Discharge status: Home / Self Care | Payer: 59 | Attending: Urgent Care | Admitting: Urgent Care

## 2021-04-27 DIAGNOSIS — R079 Chest pain, unspecified: Secondary | ICD-10-CM

## 2021-04-27 DIAGNOSIS — R0789 Other chest pain: Secondary | ICD-10-CM | POA: Diagnosis not present

## 2021-04-27 MED ORDER — TIZANIDINE HCL 4 MG PO TABS: 4.0000 mg | ORAL_TABLET | Freq: Three times a day (TID) | ORAL | 0 refills | Status: DC | PRN

## 2021-04-27 MED ORDER — NAPROXEN 375 MG PO TABS: 375.0000 mg | ORAL_TABLET | Freq: Two times a day (BID) | ORAL | 0 refills | Status: DC

## 2021-04-27 NOTE — ED Triage Notes (Signed)
Pt reports lefty sided chest pain and shortness of breath in exertion since 5 am today.  Chest pain is worse when taking a deep breath :cramping and tightness feeling" . Denies dizziness, vision changes, abdominall pain, nausea, diarrhea cough.

## 2021-04-27 NOTE — ED Notes (Signed)
While positioning for EKG, patient was lying back in supine position and mentioned chest really hurting.  Raised the back of bed and patient reported this helped her pain

## 2021-04-27 NOTE — ED Provider Notes (Signed)
[place]   MRN: [id number] DOB: October 05, 1976  Subjective:   Brittany Gillespie is a 45 y.o. female presenting for moderate persistent left-sided chest pain.  Patient states that symptoms started earlier this morning.  Feels like the chest pain is a pressure tightness sensation and is worse with taking a deep breath.  The only inciting factor that she can think of is that yesterday she did lift a heavy food delivery that had multiple ice packs and.  States that she was able to do a spin class without any issues however.    She is very anxious about it as she had a bad experience with her mother who ended up having a similar symptoms had and needed an emergency cardiac pacemaker placed.  Cannot recall specifically what cardiac condition she has.  Denies fever, headache, confusion, dizziness, diaphoresis, nausea, vomiting, abdominal pain.  No personal history of arrhythmia, heart conditions.  She does have a history of anxiety and is on medication for this.  Patient drinks alcohol 1-2 times weekly, she is not a smoker.  No current facility-administered medications for this encounter.  Current Outpatient Medications:  .  benzonatate (TESSALON) 100 MG capsule, Take 1 capsule (100 mg total) by mouth 3 (three) times daily as needed for cough., Disp: 21 capsule, Rfl: 0 .  buPROPion (WELLBUTRIN SR) 150 MG 12 hr tablet, Take 1 tablet (150 mg total) by mouth 2 (two) times daily., Disp: 180 tablet, Rfl: 3 .  clonazePAM (KLONOPIN) 0.5 MG tablet, TAKE 1 TABLET(0.5 MG) BY MOUTH TWICE DAILY AS NEEDED FOR ANXIETY OR DIFFICULTY SLEEPING, Disp: 30 tablet, Rfl: 1 .  drospirenone-ethinyl estradiol (YAZ) 3-0.02 MG tablet, Take 1 tablet by mouth daily. Take pills continuously and skip the reminder pills at the end of the pack., Disp: 112 tablet, Rfl: 3 .  ibuprofen (ADVIL,MOTRIN) 200 MG tablet, Take 200 mg by mouth daily as needed., Disp: , Rfl:  .  loratadine (CLARITIN) 10 MG tablet, Take 10 mg by mouth  daily., Disp: , Rfl:    No Known Allergies  Past Medical History:  Diagnosis Date  . Abnormal Pap smear of cervix ~2005  . Allergy   . Anxiety   . Breast calcification, left 06/2014  . Depression   . PONV (postoperative nausea and vomiting)   . Scoliosis    since childhood     Past Surgical History:  Procedure Laterality Date  . ANTERIOR CRUCIATE LIGAMENT REPAIR Right 2003  . BREAST LUMPECTOMY WITH RADIOACTIVE SEED LOCALIZATION Left 08/04/2014   Procedure: RADIOACTIVE SEED AND WIRE LOCALIZATION LUMPECTOMY LEFT BREAST;  Surgeon: Autumn Messing III, MD;  Location: [facility];  Service: General;  Laterality: Left;  . BREAST SURGERY    . CERVICAL BIOPSY  W/ LOOP ELECTRODE EXCISION  ~ 2005   [facility]   . tooth implant      Family History  Problem Relation Age of Onset  . Alzheimer's disease Maternal Grandmother   . Cancer Maternal Grandmother        breast cancer  . Breast cancer Maternal Grandmother   . Stroke Maternal Grandfather   . Heart disease Maternal Grandfather   . Glaucoma Paternal Grandmother   . Diabetes Paternal Grandfather   . Hypertension Mother   . Bradycardia Mother        Pacemaker inserted  . Mental illness Father     Social History   Tobacco Use  . Smoking status: Former Smoker    Types:  Cigarettes    Quit date: 11/27/2011    Years since quitting: 9.4  . Smokeless tobacco: Never Used  Vaping Use  . Vaping Use: Never used  Substance Use Topics  . Alcohol use: Yes    Alcohol/week: 2.0 standard drinks    Types: 2 Glasses of wine per week  . Drug use: Yes    Types: Marijuana    Comment: last used 1 week ago    ROS   Objective:   Vitals: BP (!) 149/85 (BP Location: Right Arm)   Pulse 95   Temp 98.2 F (36.8 C)   Resp 18   SpO2 100%   Physical Exam Constitutional:      General: She is not in acute distress.    Appearance: Normal appearance. She is well-developed. She is not ill-appearing, toxic-appearing or  diaphoretic.  HENT:     Head: Normocephalic and atraumatic.     Nose: Nose normal.     Mouth/Throat:     Mouth: Mucous membranes are moist.  Eyes:     Extraocular Movements: Extraocular movements intact.     Pupils: Pupils are equal, round, and reactive to light.  Cardiovascular:     Rate and Rhythm: Normal rate and regular rhythm.     Pulses: Normal pulses.     Heart sounds: Normal heart sounds. No murmur heard. No friction rub. No gallop.   Pulmonary:     Effort: Pulmonary effort is normal. No respiratory distress.     Breath sounds: Normal breath sounds. No stridor. No wheezing, rhonchi or rales.  Chest:     Chest wall: Tenderness (over left upper and lateral chest) present.  Skin:    General: Skin is warm and dry.     Findings: No rash.  Neurological:     Mental Status: She is alert and oriented to person, place, and time.  Psychiatric:     Comments: Anxious demeanor.     ED ECG REPORT   Date: 04/27/2021  Rate: 84 bpm  Rhythm: normal sinus rhythm  QRS Axis: normal  Intervals: normal  ST/T Wave abnormalities: nonspecific T wave changes  Conduction Disutrbances:none  Narrative Interpretation: Sinus rhythm at 84 bpm with nonspecific slight flattening of the T waves in lead III.  Very comparable to previous EKG from 09/16/2019.  Old EKG Reviewed: unchanged  I have personally reviewed the EKG tracing and agree with the computerized printout as noted.   Assessment and Plan :   PDMP not reviewed this encounter.  1. Atypical chest pain   2. Left-sided chest pain     Patient has clear cardiopulmonary exam, normal EKG without any acute findings.  Counseled on possibility of musculoskeletal pain from combination of lifting an unexpectedly heavy box followed by doing spin class.  Recommended naproxen and tizanidine for this as well as rest.  Also discussed possibility of anxiety as a source of her symptoms.  Recommended very close follow-up with her PCP.  Also can consider  referral to cardiology as deemed necessary by her PCP. Counseled patient on potential for adverse effects with medications prescribed/recommended today, ER and return-to-clinic precautions discussed, patient verbalized understanding.    Jaynee Eagles, PA-C 04/27/21 1345

## 2021-06-13 ENCOUNTER — Other Ambulatory Visit: Payer: Self-pay | Admitting: Family Medicine

## 2021-06-13 ENCOUNTER — Encounter: Payer: Self-pay | Admitting: Family Medicine

## 2021-06-13 DIAGNOSIS — F320 Major depressive disorder, single episode, mild: Secondary | ICD-10-CM

## 2021-06-13 NOTE — Telephone Encounter (Signed)
Requesting: clonazepam Last Visit: 10/31/20 Next Visit: 11/01/21 Last Refill: 02/27/21  Please Advise

## 2021-06-18 NOTE — Progress Notes (Signed)
[place] at [facility] [address] [place], [place] [address] [phone]  Date:  06/22/2021   Name:  Brittany Gillespie   DOB:  1976/09/24   MRN:  [id number]  PCP:  Darreld Mclean, MD    Chief Complaint: No chief complaint on file.   History of Present Illness:  Brittany Gillespie is a 45 y.o. very pleasant female patient who presents with the following:  Patient seen today for 27-monthfollow-up visit, medication check Virtual visit, patient is at her home office and I am at my office.  Patient identity confirmed with 2 factors, she gives consent for virtual visit today.  The patient and myself are present on the call Most recent visit with myself was in December She has history of depression and anxiety treated with Wellbutrin and clonazepam Her husband had cancer of the ureter diagnosed last year, at our last visit he was doing well and had returned to work He has a 6 month scan and is doing well No recurrence of his cancer is evident  Colon cancer screening- no family history  She has a cologuard kit at home and it is still in date -I encouraged her to complete this PMP D reviewed, patient last refilled a 30-day supply of clonazepam July 17.  She typically refills this medication every 6 to 8 weeks  At this time she feels like she is overall doing well with her mood-depression is not so much a concern but she may get an anxiety attack in the middle of the night  This may occur once a week or so [facility] which was attributed to anxiety  She has been on buspar and also celexa in the past She does not remember how she did with celexa but does not think she had any serious side effects  She is using clonazepam at bedtime but not every day- she will take itwhen she just feels like she may not sleep well.  She does not use it during the day  Patient Active Problem List   Diagnosis Date Noted    Knee pain, left 09/24/2016   Insomnia 09/17/2016   [place] 09/17[place]   Overweight 09/17/2016   Breast calcification, left 07/22/2014    Past Medical History:  Diagnosis Date   Abnormal Pap smear of cervix ~2005   Allergy    Anxiety    Breast calcification, left 06/2014   Depression    PONV (postoperative nausea and vomiting)    Scoliosis    since childhood    Past Surgical History:  Procedure Laterality Date   ANTERIOR CRUCIATE LIGAMENT REPAIR Right 2003   BREAST LUMPECTOMY WITH RADIOACTIVE SEED LOCALIZATION Left 08/04/2014   Procedure: RADIOACTIVE SEED AND WIRE LOCALIZATION LUMPECTOMY LEFT BREAST;  Surgeon: PAutumn MessingIII, MD;  Location: [facility]  Service: General;  Laterality: Left;   BREAST SURGERY     CERVICAL BIOPSY  W/ LOOP ELECTRODE EXCISION  ~ 2005   [facility]    tooth implant      Social History   Tobacco Use   Smoking status: Former    Types: Cigarettes    Quit date: 11/27/2011    Years since quitting: 9.5   Smokeless tobacco: Never  Vaping Use   Vaping Use: Never used  Substance Use Topics   Alcohol use: Yes    Alcohol/week: 2.0 standard drinks  Types: 2 Glasses of wine per week   Drug use: Yes    Types: Marijuana    Comment: last used 1 week ago    Family History  Problem Relation Age of Onset   Alzheimer's disease Maternal Grandmother    Cancer Maternal Grandmother        breast cancer   Breast cancer Maternal Grandmother    Stroke Maternal Grandfather    Heart disease Maternal Grandfather    Glaucoma Paternal Grandmother    Diabetes Paternal Grandfather    Hypertension Mother    Bradycardia Mother        Pacemaker inserted   Mental illness Father     No Known Allergies  Medication list has been reviewed and updated.  Current Outpatient Medications on File Prior to Visit  Medication Sig Dispense Refill   benzonatate (TESSALON) 100 MG capsule Take 1 capsule (100 mg total) by mouth 3 (three)  times daily as needed for cough. 21 capsule 0   buPROPion (WELLBUTRIN SR) 150 MG 12 hr tablet Take 1 tablet (150 mg total) by mouth 2 (two) times daily. 180 tablet 3   clonazePAM (KLONOPIN) 0.5 MG tablet TAKE 1 TABLET(0.5 MG) BY MOUTH TWICE DAILY AS NEEDED FOR ANXIETY OR DIFFICULTY SLEEPING 30 tablet 0   drospirenone-ethinyl estradiol (YAZ) 3-0.02 MG tablet Take 1 tablet by mouth daily. Take pills continuously and skip the reminder pills at the end of the pack. 112 tablet 3   ibuprofen (ADVIL,MOTRIN) 200 MG tablet Take 200 mg by mouth daily as needed.     loratadine (CLARITIN) 10 MG tablet Take 10 mg by mouth daily.     naproxen (NAPROSYN) 375 MG tablet Take 1 tablet (375 mg total) by mouth 2 (two) times daily with a meal. 30 tablet 0   tiZANidine (ZANAFLEX) 4 MG tablet Take 1 tablet (4 mg total) by mouth every 8 (eight) hours as needed. 30 tablet 0   No current facility-administered medications on file prior to visit.    Review of Systems:  As per HPI- otherwise negative.   Physical Examination: There were no vitals filed for this visit. There were no vitals filed for this visit. There is no height or weight on file to calculate BMI. Ideal Body Weight:    GEN: no acute distress Patient observed her video monitor.  She looks well, no shortness of breath is noted   Assessment and Plan: Major depressive disorder, single episode, mild ([place])  Anxiety attack  Virtual visit today to discuss depression and anxiety.  Kaydince is currently using Wellbutrin, but notes that her depression seems to be resolved.  We wonder if Wellbutrin could be contributing to some of her anxiety symptoms.  At this time we will plan to have her taper off Wellbutrin.  She is currently taking it just once a day, we will have her take every other day for about 1 week and then every third day for a week to stop Following this, she will let me know if she wishes to go on another medication such as an SSRI.  Otherwise,  can use clonazepam as needed for anxiety at bedtime  Video used for duration of visit today   Signed Lamar Blinks, MD

## 2021-06-22 ENCOUNTER — Other Ambulatory Visit: Payer: Self-pay

## 2021-06-22 ENCOUNTER — Telehealth (INDEPENDENT_AMBULATORY_CARE_PROVIDER_SITE_OTHER): Payer: 59 | Admitting: Family Medicine

## 2021-06-22 DIAGNOSIS — F410 Panic disorder [episodic paroxysmal anxiety] without agoraphobia: Secondary | ICD-10-CM | POA: Diagnosis not present

## 2021-06-22 DIAGNOSIS — F320 Major depressive disorder, single episode, mild: Secondary | ICD-10-CM

## 2021-07-14 ENCOUNTER — Other Ambulatory Visit: Payer: Self-pay | Admitting: Family Medicine

## 2021-07-14 DIAGNOSIS — F320 Major depressive disorder, single episode, mild: Secondary | ICD-10-CM

## 2021-07-14 NOTE — Telephone Encounter (Signed)
Refill? Controlled rx and last filled last month.

## 2021-08-10 ENCOUNTER — Other Ambulatory Visit: Payer: Self-pay | Admitting: Obstetrics and Gynecology

## 2021-08-10 NOTE — Telephone Encounter (Signed)
Annual exam scheduled on 08/29/21, last mammogram was on 03/2021

## 2021-08-29 ENCOUNTER — Ambulatory Visit: Payer: 59 | Admitting: Obstetrics and Gynecology

## 2021-09-14 ENCOUNTER — Other Ambulatory Visit: Payer: Self-pay | Admitting: Family Medicine

## 2021-09-14 DIAGNOSIS — F320 Major depressive disorder, single episode, mild: Secondary | ICD-10-CM

## 2021-10-28 NOTE — Patient Instructions (Addendum)
Good to see you again! I will be in touch with your labs asap  Ok to continue Wellbutrin if you feel like it is working well for you.  Clonazepam is also helpful for sleep- however, if used consistently you may have more difficulty falling asleep in your own. I would suggest not using it every night if you are able  Do that cologuard!!

## 2021-10-28 NOTE — Progress Notes (Addendum)
[place] at [facility] [address], [place], [place] [address] [phone]  Date:  11/01/2021   Name:  Brittany Gillespie   DOB:  1976-06-09   MRN:  [id number]  PCP:  Brittany Mclean, MD    Chief Complaint: Annual Exam (Concerns/ questions: pt says she never completed the cologard kit, it is still in her closet./Flu shot: received 07/2021)   History of Present Illness:  Brittany Gillespie is a 45 y.o. very pleasant female patient who presents with the following:  Pt seen today for a CPE Last seen by myself virtually in July when [name] was struggling some with depression and anxiety: [name] is currently using Wellbutrin, but notes that her depression seems to be resolved.  We wonder if Wellbutrin could be contributing to some of her anxiety symptoms.  At this time we will plan to have her taper off Wellbutrin.  She is currently taking it just once a day, we will have her take every other day for about 1 week and then every third day for a week to stop Following this, she will let me know if she wishes to go on another medication such as an SSRI.  Otherwise, can use clonazepam as needed for anxiety at bedtime  She ended up continuing to use wellbutrin and she feels like this is working for her -her anxiety is not severe, depression is well controlled.  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  She does not wish to do a colonoscopy at this time Covid booster- done  Flu - done  Labs one year ago  Mammo and pap are UTD, she sees Dr. Quincy Gillespie   Married, no children Her husband is doing well- he had urethral cancer but this is in remission  She is getting lots of exercise-she enjoys working out at [employer] both in group exercise class and with a Teacher, adult education  Denies any chest pain or shortness of breath  Wt Readings from Last 3 Encounters:  11/01/21 184 lb 12.8 oz (83.8 kg)  10/31/20 186 lb (84.4 kg)  08/23/20 183 lb (83 kg)     Patient Active Problem List   Diagnosis Date Noted   Knee pain, left 09/24/2016   Insomnia 09/17/2016   Mild depression 09/17/2016   Overweight 09/17/2016   Breast calcification, left 07/22/2014    Past Medical History:  Diagnosis Date   Abnormal Pap smear of cervix ~2005   Allergy    Anxiety    Breast calcification, left 06/2014   Depression    PONV (postoperative nausea and vomiting)    Scoliosis    since childhood    Past Surgical History:  Procedure Laterality Date   ANTERIOR CRUCIATE LIGAMENT REPAIR Right 2003   BREAST LUMPECTOMY WITH RADIOACTIVE SEED LOCALIZATION Left 08/04/2014   Procedure: RADIOACTIVE SEED AND WIRE LOCALIZATION LUMPECTOMY LEFT BREAST;  Surgeon: Autumn Messing III, MD;  Location: [facility];  Service: General;  Laterality: Left;   BREAST SURGERY     CERVICAL BIOPSY  W/ LOOP ELECTRODE EXCISION  ~ 2005   [facility]    tooth implant      Social History   Tobacco Use   Smoking status: Former    Types:  Cigarettes    Quit date: 11/27/2011    Years since quitting: 9.9   Smokeless tobacco: Never  Vaping Use   Vaping Use: Never used  Substance Use Topics   Alcohol use: Yes    Alcohol/week: 2.0 standard drinks    Types: 2 Glasses of wine per week   Drug use: Yes    Types: Marijuana    Comment: last used 1 week ago    Family History  Problem Relation Age of Onset   Alzheimer's disease Maternal Grandmother    Cancer Maternal Grandmother        breast cancer   Breast cancer Maternal Grandmother    Stroke Maternal Grandfather    Heart disease Maternal Grandfather    Glaucoma Paternal Grandmother    Diabetes Paternal Grandfather    Hypertension Mother    Bradycardia Mother        Pacemaker inserted   Mental illness Father     No Known  Allergies  Medication list has been reviewed and updated.  Current Outpatient Medications on File Prior to Visit  Medication Sig Dispense Refill   buPROPion (WELLBUTRIN SR) 150 MG 12 hr tablet Take 1 tablet (150 mg total) by mouth 2 (two) times daily. 180 tablet 3   clonazePAM (KLONOPIN) 0.5 MG tablet TAKE 1 TABLET(0.5 MG) BY MOUTH TWICE DAILY AS NEEDED FOR ANXIETY OR DIFFICULTY SLEEPING 30 tablet 3   drospirenone-ethinyl estradiol (YAZ) 3-0.02 MG tablet TAKE 1 TABLET BY MOUTH DAILY. TAKE CONTINUOUSLY AND SKIP THE REMINDER TABLETS AT THE END OF THE PACK 112 tablet 0   ibuprofen (ADVIL,MOTRIN) 200 MG tablet Take 200 mg by mouth daily as needed.     loratadine (CLARITIN) 10 MG tablet Take 10 mg by mouth daily.     Multiple Vitamin (MULTIVITAMIN) capsule Take 1 capsule by mouth daily.     No current facility-administered medications on file prior to visit.    Review of Systems:  As per HPI- otherwise negative.   Physical Examination: Vitals:   11/01/21 0857  BP: 112/80  Pulse: 77  Resp: 18  Temp: 97.7 F (36.5 C)  SpO2: 98%   Vitals:   11/01/21 0857  Weight: 184 lb 12.8 oz (83.8 kg)  Height: 5' 4"  (1.626 m)   Body mass index is 31.72 kg/m. Ideal Body Weight: Weight in (lb) to have BMI = 25: 145.3  GEN: no acute distress.  Mild obesity, looks well HEENT: Atraumatic, Normocephalic.  Bilateral TM wnl, oropharynx normal.  PEERL,EOMI.   Ears and Nose: No external deformity. CV: RRR, No M/G/R. No JVD. No thrill. No extra heart sounds. PULM: CTA B, no wheezes, crackles, rhonchi. No retractions. No resp. distress. No accessory muscle use. ABD: S, NT, ND. No rebound. No HSM. EXTR: No c/c/e PSYCH: Normally interactive. Conversant.    Assessment and Plan: Physical exam  Screening for diabetes mellitus - Plan: Comprehensive metabolic panel, Hemoglobin A1c  Screening for deficiency anemia - Plan: CBC  Screening for hyperlipidemia - Plan: Lipid panel  Screening for thyroid  disorder - Plan: TSH  Fatigue, unspecified type - Plan: TSH, VITAMIN D 25 Hydroxy (Vit-D Deficiency, Fractures)  Screening for colon cancer  Major depressive disorder, single episode, mild ([place]) - Plan: buPROPion (WELLBUTRIN SR) 150 MG 12 hr tablet   Physical exam- encouraged healthy diet and exercise routine Will plan further follow- up pending labs. Encouraged her to complete Cologuard kit She is using Wellbutrin for depression with good results, refilled She is using clonazepam at bedtime  as needed for insomnia.  I advised her this medication can be habit-forming, encouraged her to avoid every day to use to decrease this risk  Signed Lamar Blinks, MD  Received her labs as below, message to patient  Results for orders placed or performed in visit on 11/01/21  CBC  Result Value Ref Range   WBC 7.3 4.0 - 10.5 K/uL   RBC 4.00 3.87 - 5.11 Mil/uL   Platelets 275.0 150.0 - 400.0 K/uL   Hemoglobin 12.2 12.0 - 15.0 g/dL   HCT 36.8 36.0 - 46.0 %   MCV 92.0 78.0 - 100.0 fl   MCHC 33.3 30.0 - 36.0 g/dL   RDW 13.3 11.5 - 15.5 %  Comprehensive metabolic panel  Result Value Ref Range   Sodium 135 135 - 145 mEq/L   Potassium 4.2 3.5 - 5.1 mEq/L   Chloride 103 96 - 112 mEq/L   CO2 24 19 - 32 mEq/L   Glucose, Bld 87 70 - 99 mg/dL   BUN 14 6 - 23 mg/dL   Creatinine, Ser 0.61 0.40 - 1.20 mg/dL   Total Bilirubin 0.5 0.2 - 1.2 mg/dL   Alkaline Phosphatase 50 39 - 117 U/L   AST 17 0 - 37 U/L   ALT 16 0 - 35 U/L   Total Protein 6.9 6.0 - 8.3 g/dL   Albumin 4.0 3.5 - 5.2 g/dL   GFR 107.00 >60.00 mL/min   Calcium 9.1 8.4 - 10.5 mg/dL  Hemoglobin A1c  Result Value Ref Range   Hgb A1c MFr Bld 5.6 4.6 - 6.5 %  Lipid panel  Result Value Ref Range   Cholesterol 182 0 - 200 mg/dL   Triglycerides 83.0 0.0 - 149.0 mg/dL   HDL 100.40 >39.00 mg/dL   VLDL 16.6 0.0 - 40.0 mg/dL   LDL Cholesterol 65 0 - 99 mg/dL   Total CHOL/HDL Ratio 2    NonHDL 81.25   TSH  Result Value Ref Range   TSH  1.98 0.35 - 5.50 uIU/mL  VITAMIN D 25 Hydroxy (Vit-D Deficiency, Fractures)  Result Value Ref Range   VITD 26.69 (L) 30.00 - 100.00 ng/mL

## 2021-11-01 ENCOUNTER — Ambulatory Visit (INDEPENDENT_AMBULATORY_CARE_PROVIDER_SITE_OTHER): Payer: 59 | Admitting: Family Medicine

## 2021-11-01 ENCOUNTER — Encounter: Payer: Self-pay | Admitting: Family Medicine

## 2021-11-01 VITALS — BP 112/80 | HR 77 | Temp 97.7°F | Resp 18 | Ht 64.0 in | Wt 184.8 lb

## 2021-11-01 DIAGNOSIS — R5383 Other fatigue: Secondary | ICD-10-CM

## 2021-11-01 DIAGNOSIS — Z130 Encounter for screening for diseases of the blood and blood-forming organs and certain disorders involving the immune mechanism: Secondary | ICD-10-CM

## 2021-11-01 DIAGNOSIS — Z13220 Encounter for screening for lipoid disorders: Secondary | ICD-10-CM | POA: Diagnosis not present

## 2021-11-01 DIAGNOSIS — Z0000 Encounter for general adult medical examination without abnormal findings: Secondary | ICD-10-CM | POA: Diagnosis not present

## 2021-11-01 DIAGNOSIS — Z131 Encounter for screening for diabetes mellitus: Secondary | ICD-10-CM

## 2021-11-01 DIAGNOSIS — Z1329 Encounter for screening for other suspected endocrine disorder: Secondary | ICD-10-CM | POA: Diagnosis not present

## 2021-11-01 DIAGNOSIS — Z1211 Encounter for screening for malignant neoplasm of colon: Secondary | ICD-10-CM

## 2021-11-01 DIAGNOSIS — F320 Major depressive disorder, single episode, mild: Secondary | ICD-10-CM

## 2021-11-01 LAB — LIPID PANEL
Cholesterol: 182 mg/dL (ref 0–200)
HDL: 100.4 mg/dL (ref >39.00)
LDL Cholesterol: 65 mg/dL (ref 0–99)
NonHDL: 81.25
Total CHOL/HDL Ratio: 2
Triglycerides: 83 mg/dL (ref 0.0–149.0)
VLDL: 16.6 mg/dL (ref 0.0–40.0)

## 2021-11-01 LAB — CBC
HCT: 36.8 % (ref 36.0–46.0)
Hemoglobin: 12.2 g/dL (ref 12.0–15.0)
MCHC: 33.3 g/dL (ref 30.0–36.0)
MCV: 92 fl (ref 78.0–100.0)
Platelets: 275 10*3/uL (ref 150.0–400.0)
RBC: 4 Mil/uL (ref 3.87–5.11)
RDW: 13.3 % (ref 11.5–15.5)
WBC: 7.3 10*3/uL (ref 4.0–10.5)

## 2021-11-01 LAB — COMPREHENSIVE METABOLIC PANEL
ALT: 16 U/L (ref 0–35)
AST: 17 U/L (ref 0–37)
Albumin: 4 g/dL (ref 3.5–5.2)
Alkaline Phosphatase: 50 U/L (ref 39–117)
BUN: 14 mg/dL (ref 6–23)
CO2: 24 mEq/L (ref 19–32)
Calcium: 9.1 mg/dL (ref 8.4–10.5)
Chloride: 103 mEq/L (ref 96–112)
Creatinine, Ser: 0.61 mg/dL (ref 0.40–1.20)
GFR: 107 mL/min (ref >60.00)
Glucose, Bld: 87 mg/dL (ref 70–99)
Potassium: 4.2 mEq/L (ref 3.5–5.1)
Sodium: 135 mEq/L (ref 135–145)
Total Bilirubin: 0.5 mg/dL (ref 0.2–1.2)
Total Protein: 6.9 g/dL (ref 6.0–8.3)

## 2021-11-01 LAB — TSH: TSH: 1.98 u[IU]/mL (ref 0.35–5.50)

## 2021-11-01 LAB — VITAMIN D 25 HYDROXY (VIT D DEFICIENCY, FRACTURES): VITD: 26.69 ng/mL — ABNORMAL LOW (ref 30.00–100.00)

## 2021-11-01 LAB — HEMOGLOBIN A1C: Hgb A1c MFr Bld: 5.6 % (ref 4.6–6.5)

## 2021-11-01 MED ORDER — BUPROPION HCL ER (SR) 150 MG PO TB12: 150.0000 mg | ORAL_TABLET | Freq: Two times a day (BID) | ORAL | 3 refills | Status: DC

## 2021-11-07 ENCOUNTER — Other Ambulatory Visit: Payer: Self-pay | Admitting: Obstetrics and Gynecology

## 2021-11-07 NOTE — Telephone Encounter (Signed)
Annual exam scheduled on 01/24/22 Last annual exam 08/23/20 Last mammogram was on 03/2021

## 2021-11-09 ENCOUNTER — Other Ambulatory Visit: Payer: Self-pay | Admitting: Family Medicine

## 2021-11-09 DIAGNOSIS — F320 Major depressive disorder, single episode, mild: Secondary | ICD-10-CM

## 2022-01-19 ENCOUNTER — Other Ambulatory Visit: Payer: Self-pay | Admitting: Family Medicine

## 2022-01-19 DIAGNOSIS — F320 Major depressive disorder, single episode, mild: Secondary | ICD-10-CM

## 2022-01-22 ENCOUNTER — Encounter: Payer: Self-pay | Admitting: Family Medicine

## 2022-01-23 NOTE — Progress Notes (Signed)
Therapist, music at [employer] ?[address], [address] ?[place], [place] [address] ?336 [phone] ?Fax [phone] ? ?Date:  01/24/2022  ? ?Name:  Brittany Gillespie   DOB:  17-May-1976   MRN:  [id number] ? ?PCP:  Darreld Mclean, MD  ? ? ?Chief Complaint: Anxiety (Taking Wellbutrin and 1 Clonzapem nightly. ) ? ? ?History of Present Illness: ? ?Brittany Gillespie is a 46 y.o. very pleasant female patient who presents with the following: ? ?Patient seen today for virtual visit to discuss anxiety ?Patient location is home, my location is office.  Patient identity confirmed with 2 factors, she gives consent for virtual visit today ?Patient myself are present for visit today ? ?Most recent visit together was in December-at that time she was using Wellbutrin, was overall doing well but a recent job change had caused some increase in her anxiety ? ?Pt notes her new job started in late November.  She overall likes it but it is more intense than she is used to.  She is having to acquire a lot of new knowledge and skills rapidly ?She is feeling very nervous and anxious, feels like she might cry a lot of the time ?She is not having much depression -anxiety is more of her concern ?She has a harder time separating herself from work- she thinks about it all the time ?During the week she avoids doing things with friends as she is just so tired ?She is still getting a lot of exercise however ? ?No SI  ? ?She was on lexapro years ago- she thinks this worked pretty well for her but she gained weight  ? ?She cut herself down to one wellbutrin daily for the last several several weeks  ?She has taken her clonazepam once or twice a day as needed.  She notes this makes her somewhat drowsy which is not ideal ? ?Patient Active Problem List  ? Diagnosis Date Noted  ? Knee pain, left 09/24/2016  ? Insomnia 09/17/2016  ? 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 Overweight 09/17/2016  ? Breast calcification, left 07/22/2014  ? ? ?Past Medical  History:  ?Diagnosis Date  ? Abnormal Pap smear of cervix ~2005  ? Allergy   ? Anxiety   ? Breast calcification, left 06/2014  ? Depression   ? PONV (postoperative nausea and vomiting)   ? Scoliosis   ? since childhood  ? ? ?Past Surgical History:  ?Procedure Laterality Date  ? ANTERIOR CRUCIATE LIGAMENT REPAIR Right 2003  ? BREAST LUMPECTOMY WITH RADIOACTIVE SEED LOCALIZATION Left 08/04/2014  ? Procedure: RADIOACTIVE SEED AND WIRE LOCALIZATION LUMPECTOMY LEFT BREAST;  Surgeon: Autumn Messing III, MD;  Location: [facility];  Service: General;  Laterality: Left;  ? BREAST SURGERY    ? CERVICAL BIOPSY  W/ LOOP ELECTRODE EXCISION  ~ 2005  ? [facility]   ? tooth implant    ? ? ?Social History  ? ?Tobacco Use  ? Smoking status: Former  ?  Types: Cigarettes  ?  Quit date: 11/27/2011  ?  Years since quitting: 10.1  ? Smokeless tobacco: Never  ?Vaping Use  ? Vaping Use: Never used  ?Substance Use Topics  ? Alcohol use: Yes  ?  Alcohol/week: 2.0 standard drinks  ?  Types: 2 Glasses of wine per week  ? Drug use: Yes  ?  Types: Marijuana  ?  Comment: last used 1 week ago  ? ? ?Family History  ?Problem Relation Age of  Onset  ? Alzheimer's disease Maternal Grandmother   ? Cancer Maternal Grandmother   ?     breast cancer  ? Breast cancer Maternal Grandmother   ? Stroke Maternal Grandfather   ? Heart disease Maternal Grandfather   ? Glaucoma Paternal Grandmother   ? Diabetes Paternal Grandfather   ? Hypertension Mother   ? Bradycardia Mother   ?     Pacemaker inserted  ? Mental illness Father   ? ? ?No Known Allergies ? ?Medication list has been reviewed and updated. ? ?Current Outpatient Medications on File Prior to Visit  ?Medication Sig Dispense Refill  ? buPROPion (WELLBUTRIN SR) 150 MG 12 hr tablet Take 1 tablet (150 mg total) by mouth 2 (two) times daily. 180 tablet 3  ? clonazePAM (KLONOPIN) 0.5 MG tablet TAKE 1 TABLET(0.5 MG) BY MOUTH TWICE DAILY AS NEEDED FOR ANXIETY OR DIFFICULTY SLEEPING 30 tablet 2  ?  drospirenone-ethinyl estradiol (YAZ) 3-0.02 MG tablet TAKE 1 TABLET BY MOUTH DAILY, TAKE CONTINUOUSLY, AND SKIP THE REMINDING TABLETS AT THE END OF THE PACK 112 tablet 0  ? ibuprofen (ADVIL,MOTRIN) 200 MG tablet Take 200 mg by mouth daily as needed.    ? loratadine (CLARITIN) 10 MG tablet Take 10 mg by mouth daily.    ? Multiple Vitamin (MULTIVITAMIN) capsule Take 1 capsule by mouth daily.    ? ?No current facility-administered medications on file prior to visit.  ? ? ?Review of Systems: ? ?As per HPI- otherwise negative. ? ? ?Physical Examination: ?There were no vitals filed for this visit. ?There were no vitals filed for this visit. ?There is no height or weight on file to calculate BMI. ?Ideal Body Weight:   ? ?Patient observed via video monitor.  She looks well, her normal self ? ?Assessment and Plan: ?GAD (generalized anxiety disorder) - Plan: FLUoxetine (PROZAC) 20 MG tablet ? ?Following up today for concern of anxiety.  Brittany Gillespie has history of anxiety and mild depression.  She has been on Wellbutrin for some time, however since she began a new job in the fall her anxiety has worsened ? ?We talked about changing her to a different medication, she would like to try a change ? ?We will have her taper off of Wellbutrin by taking every other day for 2 doses, then start fluoxetine 20.  May increase to fluoxetine 40 mg after 2 weeks ? ?She states understanding and agreement, she will let me know how she does with this change ? ?Video used for call today ? ?Signed ?Lamar Blinks, MD ? ?

## 2022-01-24 ENCOUNTER — Telehealth (INDEPENDENT_AMBULATORY_CARE_PROVIDER_SITE_OTHER): Payer: 59 | Admitting: Family Medicine

## 2022-01-24 ENCOUNTER — Encounter: Payer: Self-pay | Admitting: Obstetrics and Gynecology

## 2022-01-24 ENCOUNTER — Other Ambulatory Visit: Payer: Self-pay

## 2022-01-24 ENCOUNTER — Ambulatory Visit (INDEPENDENT_AMBULATORY_CARE_PROVIDER_SITE_OTHER): Payer: 59 | Admitting: Obstetrics and Gynecology

## 2022-01-24 VITALS — BP 138/74 | HR 73 | Ht 63.5 in | Wt 185.0 lb

## 2022-01-24 DIAGNOSIS — Z01419 Encounter for gynecological examination (general) (routine) without abnormal findings: Secondary | ICD-10-CM

## 2022-01-24 DIAGNOSIS — F411 Generalized anxiety disorder: Secondary | ICD-10-CM

## 2022-01-24 DIAGNOSIS — Z1211 Encounter for screening for malignant neoplasm of colon: Secondary | ICD-10-CM | POA: Diagnosis not present

## 2022-01-24 MED ORDER — DROSPIRENONE-ETHINYL ESTRADIOL 3-0.02 MG PO TABS: ORAL_TABLET | ORAL | 4 refills | Status: DC

## 2022-01-24 MED ORDER — FLUOXETINE HCL 20 MG PO TABS: 20.0000 mg | ORAL_TABLET | Freq: Every day | ORAL | 3 refills | Status: DC

## 2022-01-24 NOTE — Progress Notes (Signed)
46 y.o. G10P0000 Married Caucasian female here for annual exam.   ? ?Will come off Wellbutrin and start Prozac.    ? ?Taking COCs continuously.  ?They control her periods and cramping.  ? ?Feels more hot.  ? ?Donates blood frequently.  ?Has her blood pressure checked at these visits. ? ?Taking iron once daily but not every day. ? ?PCP:  Lamar Blinks, MD  ? ?No LMP recorded. (Menstrual status: Oral contraceptives).     ?Period Cycle (Days):  (no cycle with COC) ?    ?Sexually active: Yes.    ?The current method of family planning is COC (estrogen/progesterone).    ?Exercising: Yes.     Spin, trainer and weights ?Smoker:  Former ? ?Health Maintenance: ?Pap:  08-23-20 Neg:Neg HR HPV, 12-03-16 Neg:Neg HR HPV, 07-05-14 Neg:Neg HR HPV ?History of abnormal Pap:  Yes, 2005 Hx of LEEP ?MMG:  04-06-21 Neg/BiRads1 ?Colonoscopy:  NEVER ?BMD:   n/a  Result  n/a ?TDaP:  08-03-15 Td ?Gardasil:   no ?HIV: donates blood ?Hep C: 10-31-20 Neg ?Screening Labs:  PCP ?Flu vaccine:  completed. ?Covid bivalent:  completed.  ? ? reports that she quit smoking about 10 years ago. Her smoking use included cigarettes. She has never used smokeless tobacco. She reports current alcohol use of about 2.0 standard drinks per week. She reports current drug use. Drug: Marijuana. ? ?Past Medical History:  ?Diagnosis Date  ? Abnormal Pap smear of cervix ~2005  ? Allergy   ? Anxiety   ? Breast calcification, left 06/2014  ? Depression   ? PONV (postoperative nausea and vomiting)   ? Scoliosis   ? since childhood  ? ? ?Past Surgical History:  ?Procedure Laterality Date  ? ANTERIOR CRUCIATE LIGAMENT REPAIR Right 2003  ? BREAST LUMPECTOMY WITH RADIOACTIVE SEED LOCALIZATION Left 08/04/2014  ? Procedure: RADIOACTIVE SEED AND WIRE LOCALIZATION LUMPECTOMY LEFT BREAST;  Surgeon: Autumn Messing III, MD;  Location: [facility];  Service: General;  Laterality: Left;  ? BREAST SURGERY    ? CERVICAL BIOPSY  W/ LOOP ELECTRODE EXCISION  ~ 2005  ? [facility]    ? tooth implant    ? ? ?Current Outpatient Medications  ?Medication Sig Dispense Refill  ? buPROPion (WELLBUTRIN SR) 150 MG 12 hr tablet Take 1 tablet (150 mg total) by mouth 2 (two) times daily. 180 tablet 3  ? clonazePAM (KLONOPIN) 0.5 MG tablet TAKE 1 TABLET(0.5 MG) BY MOUTH TWICE DAILY AS NEEDED FOR ANXIETY OR DIFFICULTY SLEEPING 30 tablet 2  ? drospirenone-ethinyl estradiol (YAZ) 3-0.02 MG tablet TAKE 1 TABLET BY MOUTH DAILY, TAKE CONTINUOUSLY, AND SKIP THE REMINDING TABLETS AT THE END OF THE PACK 112 tablet 0  ? ibuprofen (ADVIL,MOTRIN) 200 MG tablet Take 200 mg by mouth daily as needed.    ? loratadine (CLARITIN) 10 MG tablet Take 10 mg by mouth daily.    ? Multiple Vitamin (MULTIVITAMIN) capsule Take 1 capsule by mouth daily.    ? FLUoxetine (PROZAC) 20 MG tablet Take 1 tablet (20 mg total) by mouth daily. May increase to 40 mg after 2 weeks (Patient not taking: Reported on 01/24/2022) 60 tablet 3  ? ?No current facility-administered medications for this visit.  ? ? ?Family History  ?Problem Relation Age of Onset  ? Alzheimer's disease Maternal Grandmother   ? Cancer Maternal Grandmother   ?     breast cancer  ? Breast cancer Maternal Grandmother   ? Stroke Maternal Grandfather   ? Heart disease  Maternal Grandfather   ? Glaucoma Paternal Grandmother   ? Diabetes Paternal Grandfather   ? Hypertension Mother   ? Bradycardia Mother   ?     Pacemaker inserted  ? Mental illness Father   ? ? ?Review of Systems  ?All other systems reviewed and are negative. ? ?Exam:   ?BP 138/74   Pulse 73   Ht 5' 3.5" (1.613 m)   Wt 185 lb (83.9 kg)   SpO2 98%   BMI 32.26 kg/m?     ?General appearance: alert, cooperative and appears stated age ?Head: normocephalic, without obvious abnormality, atraumatic ?Neck: no adenopathy, supple, symmetrical, trachea midline and thyroid normal to inspection and palpation ?Lungs: clear to auscultation bilaterally ?Breasts: normal appearance, no masses or tenderness, No nipple retraction or  dimpling, No nipple discharge or bleeding, No axillary adenopathy ?Heart: regular rate and rhythm ?Abdomen: soft, non-tender; no masses, no organomegaly ?Extremities: extremities normal, atraumatic, no cyanosis or edema ?Skin: skin color, texture, turgor normal. No rashes or lesions ?Lymph nodes: cervical, supraclavicular, and axillary nodes normal. ?Neurologic: grossly normal ? ?Pelvic: External genitalia:  no lesions ?             No abnormal inguinal nodes palpated. ?             Urethra:  normal appearing urethra with no masses, tenderness or lesions ?             Bartholins and Skenes: normal    ?             Vagina: normal appearing vagina with normal color and discharge, no lesions ?             Cervix: no lesions ?             Pap taken: no ?Bimanual Exam:  Uterus:  normal size, contour, position, consistency, mobility, non-tender ?             Adnexa: no mass, fullness, tenderness ?             Rectal exam: yes.  Confirms. ?             Anus:  normal sphincter tone, no lesions ? ?Chaperone was present for exam:  yes ? ?Assessment:   ?Well woman visit with gynecologic exam. ?Hx LEEP 2005.  ?On continuous COCs.  Ok to continue.  ?Colon cancer screening.  ? ?Plan: ?Mammogram screening discussed. ?Self breast awareness reviewed. ?Pap and HR HPV 2024. ?Guidelines for Calcium, Vitamin D, regular exercise program including cardiovascular and weight bearing exercise. ?Refill COCs for one year. ?Cologuard ordered.  ?Follow up annually and prn.  ? ?After visit summary provided.  ? ? ? ?

## 2022-01-24 NOTE — Patient Instructions (Signed)

## 2022-01-29 ENCOUNTER — Telehealth: Payer: 59 | Admitting: Family Medicine

## 2022-02-18 ENCOUNTER — Other Ambulatory Visit: Payer: Self-pay | Admitting: Obstetrics and Gynecology

## 2022-02-20 ENCOUNTER — Other Ambulatory Visit: Payer: Self-pay

## 2022-02-20 DIAGNOSIS — F411 Generalized anxiety disorder: Secondary | ICD-10-CM

## 2022-02-20 MED ORDER — FLUOXETINE HCL 20 MG PO CAPS: 20.0000 mg | ORAL_CAPSULE | Freq: Every day | ORAL | 1 refills | Status: DC

## 2022-03-09 LAB — COLOGUARD: COLOGUARD: NEGATIVE

## 2022-03-12 ENCOUNTER — Encounter: Payer: Self-pay | Admitting: Family Medicine

## 2022-03-12 DIAGNOSIS — F411 Generalized anxiety disorder: Secondary | ICD-10-CM

## 2022-03-12 MED ORDER — FLUOXETINE HCL 40 MG PO CAPS: 40.0000 mg | ORAL_CAPSULE | Freq: Every day | ORAL | 3 refills | Status: DC

## 2022-04-16 ENCOUNTER — Other Ambulatory Visit: Payer: Self-pay | Admitting: Family Medicine

## 2022-04-16 DIAGNOSIS — F320 Major depressive disorder, single episode, mild: Secondary | ICD-10-CM

## 2022-04-19 ENCOUNTER — Encounter (HOSPITAL_COMMUNITY): Payer: Self-pay

## 2022-04-19 ENCOUNTER — Ambulatory Visit (HOSPITAL_COMMUNITY)
Admission: RE | Admit: 2022-04-19 | Discharge: 2022-04-19 | Disposition: A | Discharge status: Home / Self Care | Payer: 59 | Source: Ambulatory Visit | Attending: Family Medicine | Admitting: Family Medicine

## 2022-04-19 VITALS — BP 139/80 | HR 69 | Temp 97.8°F | Resp 17 | Ht 63.5 in | Wt 185.0 lb

## 2022-04-19 DIAGNOSIS — J069 Acute upper respiratory infection, unspecified: Secondary | ICD-10-CM | POA: Diagnosis not present

## 2022-04-19 DIAGNOSIS — J029 Acute pharyngitis, unspecified: Secondary | ICD-10-CM

## 2022-04-19 MED ORDER — HYDROCODONE BIT-HOMATROP MBR 5-1.5 MG/5ML PO SOLN: 5.0000 mL | Freq: Four times a day (QID) | ORAL | 0 refills | Status: DC | PRN

## 2022-04-19 NOTE — ED Provider Notes (Signed)
[facility]   [id number] 04/19/22 Arrival Time: 1600  ASSESSMENT & PLAN:  1. Viral URI with cough   2. Sore throat    Discussed typical duration of viral illnesses. OTC symptom care as needed.  Discharge Medication List as of 04/19/2022  5:46 PM     START taking these medications   Details  HYDROcodone bit-homatropine (HYCODAN) 5-1.5 MG/5ML syrup Take 5 mLs by mouth every 6 (six) hours as needed for cough., Starting Thu 04/19/2022, Normal         Follow-up Information     [place], Gay Filler, MD.   Specialty: Family Medicine Why: As needed. Contact information: [address] [place] [address] [phone]         [facility] at [facility].   Specialty: Urgent Care Why: If worsening or failing to improve as anticipated. Contact information: [place] [address] [phone]               Reviewed expectations re: course of current medical issues. Questions answered. Outlined signs and symptoms indicating need for more acute intervention. Understanding verbalized. After Visit Summary given.   SUBJECTIVE: History from: Patient. Brittany Gillespie is a 46 y.o. female. Reports: cough, nasal congestion, ST. Denies: fever and difficulty breathing. Normal PO intake without n/v/d.  OBJECTIVE:  Vitals:   04/19/22 1635 04/19/22 1636  BP:  139/80  Pulse:  69  Resp:  17  Temp:  97.8 F (36.6 C)  TempSrc:  Oral  SpO2:  100%  Weight: 83.9 kg   Height: 5' 3.5" (1.613 m)     General appearance: alert; no distress Eyes: PERRLA; EOMI; conjunctiva normal HENT: [place]; AT; with nasal congestion; throat with moderate cobblestoning Neck: supple s LAD Lungs: speaks full sentences without difficulty; unlabored Extremities: no edema Skin: warm and dry Neurologic: normal gait Psychological: alert and cooperative; normal mood and affect   No Known Allergies  Past Medical History:  Diagnosis  Date   Abnormal Pap smear of cervix ~2005   Allergy    Anxiety    Breast calcification, left 06/2014   Depression    PONV (postoperative nausea and vomiting)    Scoliosis    since childhood   Social History   Socioeconomic History   Marital status: Married    Spouse name: Not on file   Number of children: Not on file   Years of education: Not on file   Highest education level: Not on file  Occupational History   Occupation: Sales  Tobacco Use   Smoking status: Former    Types: Cigarettes    Quit date: 11/27/2011    Years since quitting: 10.4   Smokeless tobacco: Never  Vaping Use   Vaping Use: Never used  Substance and Sexual Activity   Alcohol use: Yes    Alcohol/week: 2.0 standard drinks    Types: 2 Glasses of wine per week   Drug use: Yes    Types: Marijuana    Comment: last used 1 week ago   Sexual activity: Yes    Partners: Male    Birth control/protection: Pill    Comment: Loryna--takes continuously  Other Topics Concern   Not on file  Social History Narrative   Not on file   Social Determinants of Health   Financial Resource Strain: Not on file  Food Insecurity: Not on file  Transportation Needs: Not on file  Physical Activity: Not on file  Stress: Not on  file  Social Connections: Not on file  Intimate Partner Violence: Not on file   Family History  Problem Relation Age of Onset   Alzheimer's disease Maternal Grandmother    Cancer Maternal Grandmother        breast cancer   Breast cancer Maternal Grandmother    Stroke Maternal Grandfather    Heart disease Maternal Grandfather    Glaucoma Paternal Grandmother    Diabetes Paternal Grandfather    Hypertension Mother    Bradycardia Mother        Pacemaker inserted   Mental illness Father    Past Surgical History:  Procedure Laterality Date   ANTERIOR CRUCIATE LIGAMENT REPAIR Right 2003   BREAST LUMPECTOMY WITH RADIOACTIVE SEED LOCALIZATION Left 08/04/2014   Procedure: RADIOACTIVE SEED AND WIRE  LOCALIZATION LUMPECTOMY LEFT BREAST;  Surgeon: Autumn Messing III, MD;  Location: [facility];  Service: General;  Laterality: Left;   BREAST SURGERY     CERVICAL BIOPSY  W/ LOOP ELECTRODE EXCISION  ~ 2005   [facility]    tooth implant       Vanessa Kick, MD 04/19/22 1905

## 2022-04-19 NOTE — ED Triage Notes (Signed)
Pt reports cough, nasal congestion and sore throat since Monday. States have tried sudafed and claritin with no relief.

## 2022-04-19 NOTE — Discharge Instructions (Addendum)
Be aware, your cough medication may cause drowsiness. Please do not drive, operate heavy machinery or make important decisions while on this medication, it can cloud your judgement.

## 2022-08-13 ENCOUNTER — Other Ambulatory Visit: Payer: Self-pay | Admitting: Family Medicine

## 2022-08-13 DIAGNOSIS — F320 Major depressive disorder, single episode, mild: Secondary | ICD-10-CM

## 2022-08-14 ENCOUNTER — Encounter: Payer: Self-pay | Admitting: Family Medicine

## 2022-08-14 ENCOUNTER — Other Ambulatory Visit: Payer: Self-pay | Admitting: Family Medicine

## 2022-08-14 DIAGNOSIS — Z1231 Encounter for screening mammogram for malignant neoplasm of breast: Secondary | ICD-10-CM

## 2022-09-05 ENCOUNTER — Ambulatory Visit
Admission: RE | Admit: 2022-09-05 | Discharge: 2022-09-05 | Disposition: A | Discharge status: Home / Self Care | Payer: 59 | Source: Ambulatory Visit

## 2022-09-05 DIAGNOSIS — Z1231 Encounter for screening mammogram for malignant neoplasm of breast: Secondary | ICD-10-CM

## 2022-09-21 ENCOUNTER — Encounter: Payer: Self-pay | Admitting: Family Medicine

## 2022-09-21 ENCOUNTER — Other Ambulatory Visit: Payer: Self-pay | Admitting: Family Medicine

## 2022-09-21 DIAGNOSIS — F320 Major depressive disorder, single episode, mild: Secondary | ICD-10-CM

## 2022-11-01 NOTE — Progress Notes (Deleted)
[facility] at [facility] [address], [address] [place], [place] [address] [phone]  Date:  11/05/2022   Name:  Brittany Gillespie   DOB:  [birth year]   MRN:  [id number]  PCP:  Pearline Cables, MD    Chief Complaint: No chief complaint on file.   History of Present Illness:  Brittany Gillespie is a 46 y.o. very pleasant female patient who presents with the following:  Patient seen today for physical exam Most recent visit with myself was a virtual visit in March for medication check, we did a physical 1 year ago  Married, no children.  Her husband had urethral cancer but is in remission.  She enjoys exercise, especially at the gym  At our virtual visit in May she started a new job, she was having a lot of anxiety.  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   Overweight 09/17/2016   Breast calcification, left 07/22/2014    Past Medical History:  Diagnosis Date   Abnormal Pap smear of cervix ~2005   Allergy    Anxiety    Breast calcification, left 06/2014   Depression    PONV (postoperative nausea and vomiting)    Scoliosis    since childhood    Past Surgical History:  Procedure Laterality Date   ANTERIOR CRUCIATE LIGAMENT REPAIR Right 2003   BREAST LUMPECTOMY WITH RADIOACTIVE SEED LOCALIZATION Left 08/04/2014   Procedure: RADIOACTIVE SEED AND WIRE LOCALIZATION LUMPECTOMY LEFT BREAST;  Surgeon: Chevis Pretty III, MD;  Location: [facility];  Service: General;  Laterality: Left;   BREAST SURGERY     CERVICAL BIOPSY  W/ LOOP ELECTRODE  EXCISION  ~ 2005   [facility]    tooth implant      Social History   Tobacco Use   Smoking status: Former    Types: Cigarettes    Quit date: 11/27/2011    Years since quitting: 10.9   Smokeless tobacco: Never  Vaping Use   Vaping Use: Never used  Substance Use Topics   Alcohol use: Yes    Alcohol/week: 2.0 standard drinks of alcohol    Types: 2 Glasses of wine per week   Drug use: Yes    Types: Marijuana    Comment: last used 1 week ago    Family History  Problem Relation Age of Onset   Alzheimer's disease Maternal Grandmother    Cancer Maternal Grandmother        breast cancer   Breast cancer Maternal Grandmother    Stroke Maternal Grandfather    Heart disease Maternal Grandfather    Glaucoma Paternal Grandmother    Diabetes Paternal Grandfather    Hypertension Mother    Bradycardia Mother        Pacemaker inserted   Mental illness Father     No Known Allergies  Medication list has been reviewed and updated.  Current Outpatient Medications on File Prior to Visit  Medication Sig Dispense Refill   buPROPion (WELLBUTRIN SR) 150 MG 12 hr tablet Take 1 tablet (150 mg total) by  mouth 2 (two) times daily. 180 tablet 3   clonazePAM (KLONOPIN) 0.5 MG tablet TAKE 1 TABLET(0.5 MG) BY MOUTH TWICE DAILY AS NEEDED FOR ANXIETY OR DIFFICULTY SLEEPING 30 tablet 1   drospirenone-ethinyl estradiol (YAZ) 3-0.02 MG tablet TAKE 1 TABLET BY MOUTH DAILY, TAKE CONTINUOUSLY, AND SKIP THE REMINDING TABLETS AT THE END OF THE PACK 112 tablet 4   FLUoxetine (PROZAC) 40 MG capsule Take 1 capsule (40 mg total) by mouth daily. 90 capsule 3   HYDROcodone bit-homatropine (HYCODAN) 5-1.5 MG/5ML syrup Take 5 mLs by mouth every 6 (six) hours as needed for cough. 90 mL 0   ibuprofen (ADVIL,MOTRIN) 200 MG tablet Take 200 mg by mouth daily as needed.     loratadine (CLARITIN) 10 MG tablet Take 10 mg by mouth daily.     Multiple Vitamin (MULTIVITAMIN) capsule Take 1 capsule by mouth daily.      No current facility-administered medications on file prior to visit.    Review of Systems:  As per HPI- otherwise negative.  Physical Examination: There were no vitals filed for this visit. There were no vitals filed for this visit. There is no height or weight on file to calculate BMI. Ideal Body Weight:    GEN: no acute distress. HEENT: Atraumatic, Normocephalic.  Ears and Nose: No external deformity. CV: RRR, No M/G/R. No JVD. No thrill. No extra heart sounds. PULM: CTA B, no wheezes, crackles, rhonchi. No retractions. No resp. distress. No accessory muscle use. ABD: S, NT, ND, +BS. No rebound. No HSM. EXTR: No c/c/e PSYCH: Normally interactive. Conversant.    Assessment and Plan: ***  Signed Abbe Amsterdam, MD

## 2022-11-01 NOTE — Patient Instructions (Signed)
It was great to see you again today, happy holidays!  I will be in touch with your labs soon as possible Recommend the latest COVID booster if not done already

## 2022-11-05 ENCOUNTER — Ambulatory Visit (INDEPENDENT_AMBULATORY_CARE_PROVIDER_SITE_OTHER): Payer: 59 | Admitting: Family Medicine

## 2022-11-05 DIAGNOSIS — Z130 Encounter for screening for diseases of the blood and blood-forming organs and certain disorders involving the immune mechanism: Secondary | ICD-10-CM

## 2022-11-05 DIAGNOSIS — Z91199 Patient's noncompliance with other medical treatment and regimen due to unspecified reason: Secondary | ICD-10-CM

## 2022-11-05 DIAGNOSIS — Z13220 Encounter for screening for lipoid disorders: Secondary | ICD-10-CM

## 2022-11-05 DIAGNOSIS — Z1329 Encounter for screening for other suspected endocrine disorder: Secondary | ICD-10-CM

## 2022-11-05 DIAGNOSIS — Z131 Encounter for screening for diabetes mellitus: Secondary | ICD-10-CM

## 2022-11-05 DIAGNOSIS — F411 Generalized anxiety disorder: Secondary | ICD-10-CM

## 2022-11-05 DIAGNOSIS — Z0000 Encounter for general adult medical examination without abnormal findings: Secondary | ICD-10-CM

## 2022-11-05 DIAGNOSIS — R5383 Other fatigue: Secondary | ICD-10-CM

## 2022-11-06 NOTE — Progress Notes (Signed)
No show

## 2022-12-05 ENCOUNTER — Encounter: Payer: 59 | Admitting: Family Medicine

## 2022-12-29 NOTE — Patient Instructions (Incomplete)
It was good to see you today- I will be in touch with your labs asap  Lets try increasing your fluoxetine to 60 mg, let me know how this seems to work for you over the next few weeks  Keep up the good work with exercise

## 2022-12-29 NOTE — Progress Notes (Unsigned)
[facility] [address], [place], [place] [address] [phone]  Date:  01/03/2023   Name:  Serita Degroote   DOB:  [birth year]   MRN:  [id number]  PCP:  Darreld Mclean, MD    Chief Complaint: No chief complaint on file.   History of Present Illness:  Berdene Askari is a 47 y.o. very pleasant female patient who presents with the following:  Pt seen today for a CPE Last seen by myself last year History of depression and anxiety   Pap, mammo UTD Cologuard done last year  Can update labs today   Patient Active Problem List   Diagnosis Date Noted   Knee pain, left 09/24/2016   Insomnia 09/17/2016   Mild depression 09/17/2016   Overweight 09/17/2016   Breast calcification, left 07/22/2014    Past Medical History:  Diagnosis Date   Abnormal Pap smear of cervix ~2005   Allergy    Anxiety    Breast calcification, left 06/2014   Depression    PONV (postoperative nausea and vomiting)    Scoliosis    since childhood    Past Surgical History:  Procedure Laterality Date   ANTERIOR CRUCIATE LIGAMENT REPAIR Right 2003   BREAST LUMPECTOMY WITH RADIOACTIVE SEED LOCALIZATION Left 08/04/2014   Procedure: RADIOACTIVE SEED AND WIRE LOCALIZATION LUMPECTOMY LEFT BREAST;  Surgeon: Autumn Messing III, MD;  Location: [facility];  Service: General;  Laterality: Left;   BREAST SURGERY     CERVICAL BIOPSY  W/ LOOP ELECTRODE EXCISION  ~ 2005   [facility]    tooth implant      Social History   Tobacco Use   Smoking status: Former    Types: Cigarettes    Quit date: 11/27/2011    Years since quitting: 11.0   Smokeless tobacco: Never  Vaping Use   Vaping Use: Never used  Substance Use Topics   Alcohol use: Yes    Alcohol/week: 2.0 standard drinks of alcohol    Types: 2 Glasses of wine per week   Drug use: Yes    Types: Marijuana    Comment: last used 1 week ago    Family History  Problem Relation  Age of Onset   Alzheimer's disease Maternal Grandmother    Cancer Maternal Grandmother        breast cancer   Breast cancer Maternal Grandmother    Stroke Maternal Grandfather    Heart disease Maternal Grandfather    Glaucoma Paternal Grandmother    Diabetes Paternal Grandfather    Hypertension Mother    Bradycardia Mother        Pacemaker inserted   Mental illness Father     No Known Allergies  Medication list has been reviewed and updated.  Current Outpatient Medications on File Prior to Visit  Medication Sig Dispense Refill   clonazePAM (KLONOPIN) 0.5 MG tablet TAKE 1 TABLET(0.5 MG) BY MOUTH TWICE DAILY AS NEEDED FOR ANXIETY OR DIFFICULTY SLEEPING 30 tablet 1   drospirenone-ethinyl estradiol (YAZ) 3-0.02 MG tablet TAKE 1 TABLET BY MOUTH DAILY, TAKE CONTINUOUSLY, AND SKIP THE REMINDING TABLETS AT THE END OF THE PACK 112 tablet 4   FLUoxetine (PROZAC) 40 MG capsule Take 1 capsule (40 mg total) by mouth daily. 90 capsule 3   HYDROcodone bit-homatropine (HYCODAN) 5-1.5 MG/5ML syrup Take 5 mLs by mouth every 6 (six) hours as needed for cough. 90 mL 0   ibuprofen (ADVIL,MOTRIN) 200  MG tablet Take 200 mg by mouth daily as needed.     loratadine (CLARITIN) 10 MG tablet Take 10 mg by mouth daily.     Multiple Vitamin (MULTIVITAMIN) capsule Take 1 capsule by mouth daily.     No current facility-administered medications on file prior to visit.    Review of Systems:  As per HPI- otherwise negative.   Physical Examination: There were no vitals filed for this visit. There were no vitals filed for this visit. There is no height or weight on file to calculate BMI. Ideal Body Weight:    GEN: no acute distress. HEENT: Atraumatic, Normocephalic.  Ears and Nose: No external deformity. CV: RRR, No M/G/R. No JVD. No thrill. No extra heart sounds. PULM: CTA B, no wheezes, crackles, rhonchi. No retractions. No resp. distress. No accessory muscle use. ABD: S, NT, ND, +BS. No rebound. No  HSM. EXTR: No c/c/e PSYCH: Normally interactive. Conversant.    Assessment and Plan: *** Physical exam- encouraged healthy diet and exercise routine  Signed Lamar Blinks, MD

## 2023-01-03 ENCOUNTER — Encounter: Payer: Self-pay | Admitting: Family Medicine

## 2023-01-03 ENCOUNTER — Ambulatory Visit (INDEPENDENT_AMBULATORY_CARE_PROVIDER_SITE_OTHER): Payer: 59 | Admitting: Family Medicine

## 2023-01-03 VITALS — BP 136/82 | HR 82 | Temp 97.8°F | Resp 18 | Ht 63.5 in | Wt 194.8 lb

## 2023-01-03 DIAGNOSIS — E559 Vitamin D deficiency, unspecified: Secondary | ICD-10-CM | POA: Diagnosis not present

## 2023-01-03 DIAGNOSIS — Z131 Encounter for screening for diabetes mellitus: Secondary | ICD-10-CM

## 2023-01-03 DIAGNOSIS — Z130 Encounter for screening for diseases of the blood and blood-forming organs and certain disorders involving the immune mechanism: Secondary | ICD-10-CM

## 2023-01-03 DIAGNOSIS — F320 Major depressive disorder, single episode, mild: Secondary | ICD-10-CM

## 2023-01-03 DIAGNOSIS — F411 Generalized anxiety disorder: Secondary | ICD-10-CM

## 2023-01-03 DIAGNOSIS — Z0000 Encounter for general adult medical examination without abnormal findings: Secondary | ICD-10-CM | POA: Diagnosis not present

## 2023-01-03 DIAGNOSIS — Z13220 Encounter for screening for lipoid disorders: Secondary | ICD-10-CM

## 2023-01-03 DIAGNOSIS — Z1329 Encounter for screening for other suspected endocrine disorder: Secondary | ICD-10-CM | POA: Diagnosis not present

## 2023-01-03 LAB — CBC
HCT: 36.7 % (ref 36.0–46.0)
Hemoglobin: 12.3 g/dL (ref 12.0–15.0)
MCHC: 33.5 g/dL (ref 30.0–36.0)
MCV: 91.2 fl (ref 78.0–100.0)
Platelets: 301 10*3/uL (ref 150.0–400.0)
RBC: 4.02 Mil/uL (ref 3.87–5.11)
RDW: 13.4 % (ref 11.5–15.5)
WBC: 9.7 10*3/uL (ref 4.0–10.5)

## 2023-01-03 LAB — LIPID PANEL
Cholesterol: 176 mg/dL (ref 0–200)
HDL: 90.1 mg/dL (ref >39.00)
LDL Cholesterol: 69 mg/dL (ref 0–99)
NonHDL: 85.53
Total CHOL/HDL Ratio: 2
Triglycerides: 85 mg/dL (ref 0.0–149.0)
VLDL: 17 mg/dL (ref 0.0–40.0)

## 2023-01-03 LAB — COMPREHENSIVE METABOLIC PANEL
ALT: 14 U/L (ref 0–35)
AST: 16 U/L (ref 0–37)
Albumin: 4 g/dL (ref 3.5–5.2)
Alkaline Phosphatase: 51 U/L (ref 39–117)
BUN: 13 mg/dL (ref 6–23)
CO2: 23 mEq/L (ref 19–32)
Calcium: 9.2 mg/dL (ref 8.4–10.5)
Chloride: 101 mEq/L (ref 96–112)
Creatinine, Ser: 0.66 mg/dL (ref 0.40–1.20)
GFR: 104.13 mL/min (ref >60.00)
Glucose, Bld: 95 mg/dL (ref 70–99)
Potassium: 3.8 mEq/L (ref 3.5–5.1)
Sodium: 136 mEq/L (ref 135–145)
Total Bilirubin: 0.4 mg/dL (ref 0.2–1.2)
Total Protein: 6.8 g/dL (ref 6.0–8.3)

## 2023-01-03 LAB — VITAMIN D 25 HYDROXY (VIT D DEFICIENCY, FRACTURES): VITD: 17.98 ng/mL — ABNORMAL LOW (ref 30.00–100.00)

## 2023-01-03 LAB — TSH: TSH: 1.45 u[IU]/mL (ref 0.35–5.50)

## 2023-01-03 LAB — HEMOGLOBIN A1C: Hgb A1c MFr Bld: 5.5 % (ref 4.6–6.5)

## 2023-01-03 MED ORDER — CLONAZEPAM 0.5 MG PO TABS: ORAL_TABLET | ORAL | 1 refills | Status: DC

## 2023-01-03 MED ORDER — VITAMIN D3 1.25 MG (50000 UT) PO CAPS: ORAL_CAPSULE | ORAL | 0 refills | Status: DC

## 2023-01-03 MED ORDER — FLUOXETINE HCL 20 MG PO CAPS: 60.0000 mg | ORAL_CAPSULE | Freq: Every day | ORAL | 3 refills | Status: DC

## 2023-01-03 NOTE — Addendum Note (Signed)
Addended by: Darreld Mclean on: 01/03/2023 06:17 PM   Modules accepted: Orders

## 2023-02-06 ENCOUNTER — Other Ambulatory Visit: Payer: Self-pay

## 2023-02-06 ENCOUNTER — Other Ambulatory Visit: Payer: Self-pay | Admitting: Obstetrics and Gynecology

## 2023-02-06 MED ORDER — DROSPIRENONE-ETHINYL ESTRADIOL 3-0.02 MG PO TABS: ORAL_TABLET | ORAL | 0 refills | Status: DC

## 2023-02-06 NOTE — Telephone Encounter (Signed)
Last AEX 01/24/22 Scheduled AEX 03/20/23

## 2023-03-07 ENCOUNTER — Other Ambulatory Visit: Payer: Self-pay | Admitting: Family Medicine

## 2023-03-07 DIAGNOSIS — F411 Generalized anxiety disorder: Secondary | ICD-10-CM

## 2023-03-07 NOTE — Progress Notes (Signed)
47 y.o. [id number] Married Caucasian female here for annual exam.    Having burning with urination, started last night.  No fever, back pain or blood in the urine. Patient desires to start treatment today.  Taking continuous birth control pills.   PCP:   Dr. Patsy Lageropland  No LMP recorded. (Menstrual status: Oral contraceptives).           Sexually active: Yes.    The current method of family planning is OCP (estrogen/progesterone).    Exercising: Yes.     Weight training  Smoker: former  Health Maintenance: Pap:  08-23-20 Neg:Neg HR HPV, 12-03-16 Neg:Neg HR HPV, 07-05-14 Neg:Neg HR HPV  History of abnormal Pap:  yes, 2005 Hx of LEEP  MMG:  09/05/22 Breast Density Cat C, BI-RADS CAT 1 neg Colonoscopy:  Negative 03/02/22 BMD:   n/a  Result  n/a TDaP:  08/03/15 Gardasil:   no HIV: donates blood Hep C: 10/31/20 neg Screening Labs:  PCP   reports that she quit smoking about 11 years ago. Her smoking use included cigarettes. She has never used smokeless tobacco. She reports current alcohol use of about 2.0 standard drinks of alcohol per week. She reports current drug use. Drug: Marijuana.  Past Medical History:  Diagnosis Date   Abnormal Pap smear of cervix ~2005   Allergy    Anxiety    Breast calcification, left 06/2014   Depression    PONV (postoperative nausea and vomiting)    Scoliosis    since childhood    Past Surgical History:  Procedure Laterality Date   ANTERIOR CRUCIATE LIGAMENT REPAIR Right 2003   BREAST LUMPECTOMY WITH RADIOACTIVE SEED LOCALIZATION Left 08/04/2014   Procedure: RADIOACTIVE SEED AND WIRE LOCALIZATION LUMPECTOMY LEFT BREAST;  Surgeon: Chevis PrettyPaul Toth III, MD;  Location: [facility];  Service: General;  Laterality: Left;   BREAST SURGERY     CERVICAL BIOPSY  W/ LOOP ELECTRODE EXCISION  ~ 2005   [facility]    tooth implant      Current Outpatient Medications  Medication Sig Dispense Refill   Cholecalciferol (VITAMIN D3) 1.25 MG (50000 UT) CAPS  Take 1 weekly for 12 weeks 12 capsule 0   clonazePAM (KLONOPIN) 0.5 MG tablet TAKE 1 TABLET(0.5 MG) BY MOUTH TWICE DAILY AS NEEDED FOR ANXIETY OR DIFFICULTY SLEEPING 30 tablet 2   drospirenone-ethinyl estradiol (YAZ) 3-0.02 MG tablet TAKE 1 TABLET BY MOUTH DAILY, TAKE CONTINUOUSLY, AND SKIP THE REMINDING TABLETS AT THE END OF THE PACK 112 tablet 0   FLUoxetine (PROZAC) 20 MG capsule Take 3 capsules (60 mg total) by mouth daily. 270 capsule 3   ibuprofen (ADVIL,MOTRIN) 200 MG tablet Take 200 mg by mouth daily as needed.     loratadine (CLARITIN) 10 MG tablet Take 10 mg by mouth daily.     Multiple Vitamin (MULTIVITAMIN) capsule Take 1 capsule by mouth daily.     No current facility-administered medications for this visit.    Family History  Problem Relation Age of Onset   Alzheimer's disease Maternal Grandmother    Cancer Maternal Grandmother        breast cancer   Breast cancer Maternal Grandmother    Stroke Maternal Grandfather    Heart disease Maternal Grandfather    Glaucoma Paternal Grandmother    Diabetes Paternal Grandfather    Hypertension Mother    Bradycardia Mother        Pacemaker inserted   Mental illness Father     Review of Systems  Genitourinary:  Positive for dysuria.    Exam:   BP 122/78   Pulse 81   Ht 5' 4.5" (1.638 m)   Wt 192 lb (87.1 kg)   SpO2 98%   BMI 32.45 kg/m     General appearance: alert, cooperative and appears stated age Head: normocephalic, without obvious abnormality, atraumatic Neck: no adenopathy, supple, symmetrical, trachea midline and thyroid normal to inspection and palpation Lungs: clear to auscultation bilaterally Breasts: normal appearance, no masses or tenderness, No nipple retraction or dimpling, No nipple discharge or bleeding, No axillary adenopathy Heart: regular rate and rhythm Abdomen: soft, non-tender; no masses, no organomegaly Extremities: extremities normal, atraumatic, no cyanosis or edema Skin: skin color, texture,  turgor normal. No rashes or lesions Lymph nodes: cervical, supraclavicular, and axillary nodes normal. Neurologic: grossly normal  Pelvic: External genitalia:  no lesions              No abnormal inguinal nodes palpated.              Urethra:  normal appearing urethra with no masses, tenderness or lesions              Bartholins and Skenes: normal                 Vagina: normal appearing vagina with normal color and discharge, no lesions              Cervix: no lesions              Pap taken: yes.  Cervix friable with pap collection.  No lesions noted.  Bimanual Exam:  Uterus:  normal size, contour, position, consistency, mobility, non-tender              Adnexa: no mass, fullness, tenderness              Rectal exam: declined  Chaperone was present for exam:  Warren Lacy, CMA  Assessment:   Well woman visit with gynecologic exam. Hx LEEP 2005.  On continuous COCs.  Ok to continue.  Dysuria.  Cystitis.  Plan: Mammogram screening discussed. Self breast awareness reviewed. Pap and HR HPV colleted.  Guidelines for Calcium, Vitamin D, regular exercise program including cardiovascular and weight bearing exercise. Refill of Yaz continuous contraception.  4 packs and 3 refills.  Urinalysis:  sg 1.015, ph 6.0, 0 - 5 WBC, 3 - 10 RBC, few bacteria.  UC sent.  Start Bactrim DS po bid x 3 days.  Follow up annually and prn.   After visit summary provided.   In addition to the annual exam today, the patient was evaluated and treated for dysuria and cystitis.  She had a urinalysis and culture and received a prescription for Bactrim DS today.

## 2023-03-11 ENCOUNTER — Other Ambulatory Visit: Payer: Self-pay | Admitting: Family Medicine

## 2023-03-11 DIAGNOSIS — F320 Major depressive disorder, single episode, mild: Secondary | ICD-10-CM

## 2023-03-20 ENCOUNTER — Encounter: Payer: Self-pay | Admitting: Obstetrics and Gynecology

## 2023-03-20 ENCOUNTER — Other Ambulatory Visit (HOSPITAL_COMMUNITY)
Admission: RE | Admit: 2023-03-20 | Discharge: 2023-03-20 | Disposition: A | Discharge status: Home / Self Care | Payer: 59 | Source: Ambulatory Visit | Attending: Obstetrics and Gynecology | Admitting: Obstetrics and Gynecology

## 2023-03-20 ENCOUNTER — Ambulatory Visit (INDEPENDENT_AMBULATORY_CARE_PROVIDER_SITE_OTHER): Payer: 59 | Admitting: Obstetrics and Gynecology

## 2023-03-20 VITALS — BP 122/78 | HR 81 | Ht 64.5 in | Wt 192.0 lb

## 2023-03-20 DIAGNOSIS — Z124 Encounter for screening for malignant neoplasm of cervix: Secondary | ICD-10-CM | POA: Insufficient documentation

## 2023-03-20 DIAGNOSIS — R300 Dysuria: Secondary | ICD-10-CM

## 2023-03-20 DIAGNOSIS — Z01419 Encounter for gynecological examination (general) (routine) without abnormal findings: Secondary | ICD-10-CM | POA: Diagnosis not present

## 2023-03-20 DIAGNOSIS — N3090 Cystitis, unspecified without hematuria: Secondary | ICD-10-CM | POA: Diagnosis not present

## 2023-03-20 MED ORDER — SULFAMETHOXAZOLE-TRIMETHOPRIM 800-160 MG PO TABS
1.0000 | ORAL_TABLET | Freq: Two times a day (BID) | ORAL | 0 refills | Status: DC
Start: 2023-03-20 — End: 2023-07-01

## 2023-03-20 MED ORDER — DROSPIRENONE-ETHINYL ESTRADIOL 3-0.02 MG PO TABS: ORAL_TABLET | ORAL | 3 refills | Status: DC

## 2023-03-20 NOTE — Patient Instructions (Signed)
EXERCISE AND DIET:  We recommended that you start or continue a regular exercise program for good health. Regular exercise means any activity that makes your heart beat faster and makes you sweat.  We recommend exercising at least 30 minutes per day at least 3 days a week, preferably 4 or 5.  We also recommend a diet low in fat and sugar.  Inactivity, poor dietary choices and obesity can cause diabetes, heart attack, stroke, and kidney damage, among others.    ALCOHOL AND SMOKING:  Women should limit their alcohol intake to no more than 7 drinks/beers/glasses of wine (combined, not each!) per week. Moderation of alcohol intake to this level decreases your risk of breast cancer and liver damage. And of course, no recreational drugs are part of a healthy lifestyle.  And absolutely no smoking or even second hand smoke. Most people know smoking can cause heart and lung diseases, but did you know it also contributes to weakening of your bones? Aging of your skin?  Yellowing of your teeth and nails?  CALCIUM AND VITAMIN D:  Adequate intake of calcium and Vitamin D are recommended.  The recommendations for exact amounts of these supplements seem to change often, but generally speaking 600 mg of calcium (either carbonate or citrate) and 800 units of Vitamin D per day seems prudent. Certain women may benefit from higher intake of Vitamin D.  If you are among these women, your doctor will have told you during your visit.    PAP SMEARS:  Pap smears, to check for cervical cancer or precancers,  have traditionally been done yearly, although recent scientific advances have shown that most women can have pap smears less often.  However, every woman still should have a physical exam from her gynecologist every year. It will include a breast check, inspection of the vulva and vagina to check for abnormal growths or skin changes, a visual exam of the cervix, and then an exam to evaluate the size and shape of the uterus and  ovaries.  And after 48 years of age, a rectal exam is indicated to check for rectal cancers. We will also provide age appropriate advice regarding health maintenance, like when you should have certain vaccines, screening for sexually transmitted diseases, bone density testing, colonoscopy, mammograms, etc.   MAMMOGRAMS:  All women over 40 years old should have a yearly mammogram. Many facilities now offer a "3D" mammogram, which may cost around $50 extra out of pocket. If possible,  we recommend you accept the option to have the 3D mammogram performed.  It both reduces the number of women who will be called back for extra views which then turn out to be normal, and it is better than the routine mammogram at detecting truly abnormal areas.    COLONOSCOPY:  Colonoscopy to screen for colon cancer is recommended for all women at age 50.  We know, you hate the idea of the prep.  We agree, BUT, having colon cancer and not knowing it is worse!!  Colon cancer so often starts as a polyp that can be seen and removed at colonscopy, which can quite literally save your life!  And if your first colonoscopy is normal and you have no family history of colon cancer, most women don't have to have it again for 10 years.  Once every ten years, you can do something that may end up saving your life, right?  We will be happy to help you get it scheduled when you are ready.    Be sure to check your insurance coverage so you understand how much it will cost.  It may be covered as a preventative service at no cost, but you should check your particular policy.    Calcium Content in Foods Calcium is the most abundant mineral in the body. Most of the body's calcium supply is stored in bones and teeth. Calcium helps many parts of the body function normally, including: Blood and blood vessels. Nerves. Hormones. Muscles. Bones and teeth. When your calcium stores are low, you may be at risk for low bone mass, bone loss, and broken bones  (fractures). When you get enough calcium, it helps to support strong bones and teeth throughout your life. Calcium is especially important for: Children during growth spurts. Girls during adolescence. Women who are pregnant or breastfeeding. Women after their menstrual cycle stops (postmenopause). Women whose menstrual cycle has stopped due to anorexia nervosa or regular intense exercise. People who cannot eat or digest dairy products. Vegans. Recommended daily amounts of calcium: Women (ages 19 to 50): 1,000 mg per day. Women (ages 51 and older): 1,200 mg per day. Men (ages 19 to 70): 1,000 mg per day. Men (ages 71 and older): 1,200 mg per day. Women (ages 9 to 18): 1,300 mg per day. Men (ages 9 to 18): 1,300 mg per day. General information Eat foods that are high in calcium. Try to get most of your calcium from food. Some people may benefit from taking calcium supplements. Check with your health care provider or diet and nutrition specialist (dietitian) before starting any calcium supplements. Calcium supplements may interact with certain medicines. Too much calcium may cause other health problems, such as constipation and kidney stones. For the body to absorb calcium, it needs vitamin D. Sources of vitamin D include: Skin exposure to direct sunlight. Foods, such as egg yolks, liver, mushrooms, saltwater fish, and fortified milk. Vitamin D supplements. Check with your health care provider or dietitian before starting any vitamin D supplements. What foods are high in calcium?  Foods that are high in calcium contain more than 100 milligrams per serving. Fruits Fortified orange juice or other fruit juice, 300 mg per 8 oz serving. Vegetables Collard greens, 360 mg per 8 oz serving. Kale, 100 mg per 8 oz serving. Bok choy, 160 mg per 8 oz serving. Grains Fortified ready-to-eat cereals, 100 to 1,000 mg per 8 oz serving. Fortified frozen waffles, 200 mg in 2 waffles. Oatmeal, 140 mg in  1 cup. Meats and other proteins Sardines, canned with bones, 325 mg per 3 oz serving. Salmon, canned with bones, 180 mg per 3 oz serving. Canned shrimp, 125 mg per 3 oz serving. Baked beans, 160 mg per 4 oz serving. Tofu, firm, made with calcium sulfate, 253 mg per 4 oz serving. Dairy Yogurt, plain, low-fat, 310 mg per 6 oz serving. Nonfat milk, 300 mg per 8 oz serving. American cheese, 195 mg per 1 oz serving. Cheddar cheese, 205 mg per 1 oz serving. Cottage cheese 2%, 105 mg per 4 oz serving. Fortified soy, rice, or almond milk, 300 mg per 8 oz serving. Mozzarella, part skim, 210 mg per 1 oz serving. The items listed above may not be a complete list of foods high in calcium. Actual amounts of calcium may be different depending on processing. Contact a dietitian for more information. What foods are lower in calcium? Foods that are lower in calcium contain 50 mg or less per serving. Fruits Apple, about 6 mg. Banana, about 12 mg.   Vegetables Lettuce, 19 mg per 2 oz serving. Tomato, about 11 mg. Grains Rice, 4 mg per 6 oz serving. Boiled potatoes, 14 mg per 8 oz serving. White bread, 6 mg per slice. Meats and other proteins Egg, 27 mg per 2 oz serving. Red meat, 7 mg per 4 oz serving. Chicken, 17 mg per 4 oz serving. Fish, cod, or trout, 20 mg per 4 oz serving. Dairy Cream cheese, regular, 14 mg per 1 Tbsp serving. Brie cheese, 50 mg per 1 oz serving. Parmesan cheese, 70 mg per 1 Tbsp serving. The items listed above may not be a complete list of foods lower in calcium. Actual amounts of calcium may be different depending on processing. Contact a dietitian for more information. Summary Calcium is an important mineral in the body because it affects many functions. Getting enough calcium helps support strong bones and teeth throughout your life. Try to get most of your calcium from food. Calcium supplements may interact with certain medicines. Check with your health care provider  or dietitian before starting any calcium supplements. This information is not intended to replace advice given to you by your health care provider. Make sure you discuss any questions you have with your health care provider. Document Revised: 03/09/2020 Document Reviewed: 03/09/2020 Elsevier Patient Education  2023 Elsevier Inc.  

## 2023-03-22 LAB — URINALYSIS, COMPLETE W/RFL CULTURE
Bilirubin Urine: NEGATIVE
Glucose, UA: NEGATIVE
Hyaline Cast: NONE SEEN /LPF
Ketones, ur: NEGATIVE
Leukocyte Esterase: NEGATIVE
Nitrites, Initial: NEGATIVE
Protein, ur: NEGATIVE
Specific Gravity, Urine: 1.015 (ref 1.001–1.035)
pH: 6 (ref 5.0–8.0)

## 2023-03-22 LAB — URINE CULTURE
MICRO NUMBER:: 14867876
Result:: NO GROWTH
SPECIMEN QUALITY:: ADEQUATE

## 2023-03-22 LAB — CULTURE INDICATED

## 2023-03-25 LAB — CYTOLOGY - PAP
Adequacy: ABSENT
Comment: NEGATIVE
Diagnosis: NEGATIVE
High risk HPV: NEGATIVE

## 2023-04-07 ENCOUNTER — Other Ambulatory Visit: Payer: Self-pay | Admitting: Obstetrics and Gynecology

## 2023-04-08 NOTE — Telephone Encounter (Signed)
Med refill request received for Yaz, #112/ #RF from [facility] on file.   Rx refused. RF not appropriate.  Rx sent on 03/20/23 #112/3RF  Routing to Dr. Edward Jolly for final review.

## 2023-05-30 ENCOUNTER — Encounter: Payer: Self-pay | Admitting: Family Medicine

## 2023-06-07 ENCOUNTER — Other Ambulatory Visit: Payer: Self-pay | Admitting: Family Medicine

## 2023-06-07 DIAGNOSIS — F320 Major depressive disorder, single episode, mild: Secondary | ICD-10-CM

## 2023-06-08 ENCOUNTER — Encounter: Payer: Self-pay | Admitting: Family Medicine

## 2023-06-27 NOTE — Progress Notes (Signed)
[facility] [address], [address] [place], [place] [address] [phone]  Date:  07/01/2023   Name:  Brittany Gillespie   DOB:  [birth year]   MRN:  [id number]  PCP:  Pearline Cables, MD    Chief Complaint: No chief complaint on file.   History of Present Illness:  Brittany Gillespie is a 47 y.o. very pleasant female patient who presents with the following:  Pt seen today for a virtual recheck visit Last seen by myself in February for her CPE History of depression and anxiety - at her CPE she was having more sx of same Refill clonazepam that she uses as needed Discussed her anxiety, her symptoms are significantly worse.  We will try increasing her from 40 to 60 mg of fluoxetine, I have asked her to let me know how she responds  She got laid off in June and is looking for work- she is talking to me from her home I am at my office Pt ID confirmed with 2 factors and she gives consent for a virtual visit today- the pt and myself are present on the call today. Her job was stressful but is also hard being out of work- she is looking for work again   She is still taking 60 mg of fluoxetine, she feels like it really helps control her anxiety- even being unemployed has been ok .  She would like to continue this dosage She does get nervous at night sometimes She typically takes 1 clonazepam clonazepam at night- rarely uses during the day, mostly just at night   She had to give up her therapist when her insurance changed but she is hoping to find someone who takes her new plan  Overall she is doing okay, just needed her periodic medication check   Patient Active Problem List   Diagnosis Date Noted   Knee pain, left 09/24/2016   Insomnia 09/17/2016   Mild depression 09/17/2016   Overweight 09/17/2016   Breast calcification, left 07/22/2014    Past Medical History:  Diagnosis Date   Abnormal Pap smear of cervix ~2005   Allergy     Anxiety    Breast calcification, left 06/2014   Depression    PONV (postoperative nausea and vomiting)    Scoliosis    since childhood    Past Surgical History:  Procedure Laterality Date   ANTERIOR CRUCIATE LIGAMENT REPAIR Right 2003   BREAST LUMPECTOMY WITH RADIOACTIVE SEED LOCALIZATION Left 08/04/2014   Procedure: RADIOACTIVE SEED AND WIRE LOCALIZATION LUMPECTOMY LEFT BREAST;  Surgeon: Chevis Pretty III, MD;  Location: [facility];  Service: General;  Laterality: Left;   BREAST SURGERY     CERVICAL BIOPSY  W/ LOOP ELECTRODE EXCISION  ~ 2005   [facility]    tooth implant      Social History   Tobacco Use   Smoking status: Former    Current packs/day: 0.00    Types: Cigarettes    Quit date: 11/27/2011    Years since quitting: 11.5   Smokeless tobacco: Never  Vaping Use   Vaping status: Never Used  Substance Use Topics   Alcohol use: Yes    Alcohol/week: 2.0 standard drinks of alcohol    Types: 2 Glasses of wine per week   Drug use: Yes    Types: Marijuana    Comment: last used 1 week ago    Family History  Problem Relation Age  of Onset   Alzheimer's disease Maternal Grandmother    Cancer Maternal Grandmother        breast cancer   Breast cancer Maternal Grandmother    Stroke Maternal Grandfather    Heart disease Maternal Grandfather    Glaucoma Paternal Grandmother    Diabetes Paternal Grandfather    Hypertension Mother    Bradycardia Mother        Pacemaker inserted   Mental illness Father     No Known Allergies  Medication list has been reviewed and updated.  Current Outpatient Medications on File Prior to Visit  Medication Sig Dispense Refill   Cholecalciferol (VITAMIN D3) 1.25 MG (50000 UT) CAPS Take 1 weekly for 12 weeks 12 capsule 0   clonazePAM (KLONOPIN) 0.5 MG tablet TAKE 1 TABLET(0.5 MG) BY MOUTH TWICE DAILY AS NEEDED FOR ANXIETY OR DIFFICULTY SLEEPING 30 tablet 1   drospirenone-ethinyl estradiol (YAZ) 3-0.02 MG tablet TAKE 1  TABLET BY MOUTH DAILY, TAKE CONTINUOUSLY, AND SKIP THE REMINDING TABLETS AT THE END OF THE PACK 112 tablet 3   FLUoxetine (PROZAC) 20 MG capsule Take 3 capsules (60 mg total) by mouth daily. 270 capsule 3   ibuprofen (ADVIL,MOTRIN) 200 MG tablet Take 200 mg by mouth daily as needed.     loratadine (CLARITIN) 10 MG tablet Take 10 mg by mouth daily.     Multiple Vitamin (MULTIVITAMIN) capsule Take 1 capsule by mouth daily.     sulfamethoxazole-trimethoprim (BACTRIM DS) 800-160 MG tablet Take 1 tablet by mouth 2 (two) times daily. One PO BID x 3 days 6 tablet 0   No current facility-administered medications on file prior to visit.    Review of Systems:  As per HPI- otherwise negative.   Physical Examination: There were no vitals filed for this visit. There were no vitals filed for this visit. There is no height or weight on file to calculate BMI. Ideal Body Weight:    Patient is here via MyChart video, she looks well and her normal self.  No distress observed   Assessment and Plan: GAD (generalized anxiety disorder)  Virtual visit today to discuss anxiety.  Sienna is doing well with her current dose of fluoxetine and clonazepam.  She will follow-up with me in about 6 months  Signed Abbe Amsterdam, MD

## 2023-07-01 ENCOUNTER — Telehealth (INDEPENDENT_AMBULATORY_CARE_PROVIDER_SITE_OTHER): Payer: BC Managed Care – PPO | Admitting: Family Medicine

## 2023-07-01 DIAGNOSIS — F411 Generalized anxiety disorder: Secondary | ICD-10-CM

## 2023-08-09 ENCOUNTER — Other Ambulatory Visit: Payer: Self-pay | Admitting: Family Medicine

## 2023-08-09 DIAGNOSIS — F320 Major depressive disorder, single episode, mild: Secondary | ICD-10-CM

## 2023-08-21 ENCOUNTER — Other Ambulatory Visit: Payer: Self-pay | Admitting: Family Medicine

## 2023-08-21 DIAGNOSIS — Z0000 Encounter for general adult medical examination without abnormal findings: Secondary | ICD-10-CM

## 2023-09-09 ENCOUNTER — Ambulatory Visit
Admission: RE | Admit: 2023-09-09 | Discharge: 2023-09-09 | Disposition: A | Discharge status: Home / Self Care | Payer: BC Managed Care – PPO | Source: Ambulatory Visit | Attending: Family Medicine | Admitting: Family Medicine

## 2023-09-09 DIAGNOSIS — Z0000 Encounter for general adult medical examination without abnormal findings: Secondary | ICD-10-CM

## 2023-09-09 DIAGNOSIS — Z1231 Encounter for screening mammogram for malignant neoplasm of breast: Secondary | ICD-10-CM | POA: Diagnosis not present

## 2023-11-26 DIAGNOSIS — J0100 Acute maxillary sinusitis, unspecified: Secondary | ICD-10-CM | POA: Diagnosis not present

## 2023-11-26 DIAGNOSIS — R051 Acute cough: Secondary | ICD-10-CM | POA: Diagnosis not present

## 2023-12-03 NOTE — Progress Notes (Deleted)
 [facility] [address], [address] [place], [place] [address] [phone]  Date:  12/05/2023   Name:  Brittany Gillespie   DOB:  [birth year]   MRN:  [id number]  PCP:  Watt Harlene BROCKS, MD    Chief Complaint: No chief complaint on file.   History of Present Illness:  Brittany Gillespie is a 48 y.o. 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/17/2016   Overweight 09/17/2016   Breast calcification, left 07/22/2014    Past Medical History:  Diagnosis Date   Abnormal Pap smear of cervix ~2005   Allergy    Anxiety    Breast calcification, left 06/2014   Depression    PONV (postoperative nausea and vomiting)    Scoliosis    since childhood    Past Surgical History:  Procedure Laterality Date   ANTERIOR CRUCIATE LIGAMENT REPAIR Right 2003   BREAST LUMPECTOMY WITH RADIOACTIVE SEED LOCALIZATION Left 08/04/2014   Procedure: RADIOACTIVE SEED AND WIRE LOCALIZATION LUMPECTOMY LEFT BREAST;  Surgeon: Deward Null III, MD;  Location: [facility];  Service: General;  Laterality: Left;   BREAST SURGERY     CERVICAL BIOPSY  W/ LOOP ELECTRODE EXCISION  ~ 2005   [facility]    tooth implant      Social History   Tobacco Use   Smoking status: Former    Current packs/day: 0.00    Types: Cigarettes    Quit date: 11/27/2011    Years since quitting: 12.0   Smokeless tobacco: Never  Vaping Use   Vaping status: Never Used  Substance Use Topics   Alcohol use: Yes    Alcohol/week: 2.0 standard drinks of alcohol    Types: 2 Glasses  of wine per week   Drug use: Yes    Types: Marijuana    Comment: last used 1 week ago    Family History  Problem Relation Age of Onset   Alzheimer's disease Maternal Grandmother    Cancer Maternal Grandmother        breast cancer   Breast cancer Maternal Grandmother    Stroke Maternal Grandfather    Heart disease Maternal Grandfather    Glaucoma Paternal Grandmother    Diabetes Paternal Grandfather    Hypertension Mother    Bradycardia Mother        Pacemaker inserted   Mental illness Father     No Known Allergies  Medication list has been reviewed and updated.  Current Outpatient Medications on File Prior to Visit  Medication Sig Dispense Refill   Cholecalciferol (VITAMIN D3) 1.25 MG (50000 UT) CAPS Take 1 weekly for 12 weeks 12 capsule 0   clonazePAM  (KLONOPIN ) 0.5 MG tablet TAKE 1 TABLET(0.5 MG) BY MOUTH TWICE DAILY AS NEEDED FOR ANXIETY OR DIFFICULTY SLEEPING 30 tablet 3   drospirenone -ethinyl estradiol  (YAZ) 3-0.02 MG tablet TAKE 1 TABLET BY MOUTH DAILY, TAKE CONTINUOUSLY, AND SKIP THE REMINDING TABLETS AT THE END OF THE PACK 112 tablet 3   FLUoxetine  (PROZAC ) 20 MG capsule Take 3 capsules (60  mg total) by mouth daily. 270 capsule 3   ibuprofen (ADVIL,MOTRIN) 200 MG tablet Take 200 mg by mouth daily as needed.     loratadine (CLARITIN) 10 MG tablet Take 10 mg by mouth daily.     Multiple Vitamin (MULTIVITAMIN) capsule Take 1 capsule by mouth daily.     No current facility-administered medications on file prior to visit.    Review of Systems:  As per HPI- otherwise negative.   Physical Examination: There were no vitals filed for this visit. There were no vitals filed for this visit. There is no height or weight on file to calculate BMI. Ideal Body Weight:    GEN: no acute distress. HEENT: Atraumatic, Normocephalic.  Ears and Nose: No external deformity. CV: RRR, No M/G/R. No JVD. No thrill. No extra heart sounds. PULM: CTA B, no wheezes, crackles, rhonchi. No  retractions. No resp. distress. No accessory muscle use. ABD: S, NT, ND, +BS. No rebound. No HSM. EXTR: No c/c/e PSYCH: Normally interactive. Conversant.    Assessment and Plan: ***  Signed Harlene Schroeder, MD

## 2023-12-05 ENCOUNTER — Telehealth (INDEPENDENT_AMBULATORY_CARE_PROVIDER_SITE_OTHER): Payer: No Typology Code available for payment source | Admitting: Family Medicine

## 2023-12-05 ENCOUNTER — Encounter: Payer: Self-pay | Admitting: Family Medicine

## 2023-12-05 ENCOUNTER — Ambulatory Visit: Payer: BC Managed Care – PPO | Admitting: Family Medicine

## 2023-12-05 DIAGNOSIS — R059 Cough, unspecified: Secondary | ICD-10-CM | POA: Diagnosis not present

## 2023-12-05 DIAGNOSIS — J0111 Acute recurrent frontal sinusitis: Secondary | ICD-10-CM

## 2023-12-05 MED ORDER — AMOXICILLIN-POT CLAVULANATE 875-125 MG PO TABS
1.0000 | ORAL_TABLET | Freq: Two times a day (BID) | ORAL | 0 refills | Status: DC
Start: 2023-12-05 — End: 2024-04-23

## 2023-12-05 MED ORDER — HYDROCODONE BIT-HOMATROP MBR 5-1.5 MG/5ML PO SOLN
5.0000 mL | Freq: Three times a day (TID) | ORAL | 0 refills | Status: AC | PRN
Start: 2023-12-05 — End: 2023-12-10

## 2023-12-05 NOTE — Progress Notes (Signed)
 [facility] [address], [address] [place], [place] [address] [phone]  Date:  12/05/2023   Name:  Brittany Gillespie   DOB:  02-07-1976   MRN:  [id number]  PCP:  Brittany Harlene BROCKS, MD    Chief Complaint: Sinusitis   History of Present Illness:  Brittany Gillespie is a 48 y.o. very pleasant female patient who presents with the following:  Patient seen today with concern of cough for sinus infection Connected with her via mychart video Patient and myself are present on the call, patient location is her workplace and my location is office.  Patient ID confirmed with 2 factors, she gives consent for us  to proceed with MyChart visit today  She has been sick since Christmas day with cold sx  She went to UC on 12/31- tested neg for covid and flu  Currently her sx are cough and sinus pressure She is coughing up mucus She had a temp at first but not since last week No GI symptoms  She did have some body aches and chills but now improved -overall she feels better but is still just coughing has quite a bit of sinus pressure  OTC she is using mucinex DM, sudafed, benadryl She and her husband are bothered by her cough which is keeping her awake at night.  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   Overweight 09/17/2016   Breast calcification, left 07/22/2014    Past Medical History:  Diagnosis Date   Abnormal Pap smear of cervix ~2005   Allergy    Anxiety    Breast calcification, left 06/2014   Depression    PONV (postoperative nausea and vomiting)    Scoliosis    since  childhood    Past Surgical History:  Procedure Laterality Date   ANTERIOR CRUCIATE LIGAMENT REPAIR Right 2003   BREAST LUMPECTOMY WITH RADIOACTIVE SEED LOCALIZATION Left 08/04/2014   Procedure: RADIOACTIVE SEED AND WIRE LOCALIZATION LUMPECTOMY LEFT BREAST;  Surgeon: Deward Null III, MD;  Location: [facility];  Service: General;  Laterality: Left;   BREAST SURGERY     CERVICAL BIOPSY  W/ LOOP ELECTRODE EXCISION  ~ 2005   [facility]    tooth implant      Social History   Tobacco Use   Smoking status: Former    Current packs/day: 0.00    Types: Cigarettes    Quit date: 11/27/2011    Years since quitting: 12.0   Smokeless tobacco: Never  Vaping Use   Vaping status: Never Used  Substance Use Topics   Alcohol use: Yes    Alcohol/week: 2.0 standard drinks of alcohol    Types: 2 Glasses of wine per week   Drug use: Yes    Types: Marijuana    Comment: last used 1 week ago    Family History  Problem Relation Age of Onset   Alzheimer's disease Maternal Grandmother    Cancer Maternal Grandmother        breast cancer   Breast cancer Maternal Grandmother  Stroke Maternal Grandfather    Heart disease Maternal Grandfather    Glaucoma Paternal Grandmother    Diabetes Paternal Grandfather    Hypertension Mother    Bradycardia Mother        Pacemaker inserted   Mental illness Father     No Known Allergies  Medication list has been reviewed and updated.  Current Outpatient Medications on File Prior to Visit  Medication Sig Dispense Refill   Cholecalciferol (VITAMIN D3) 1.25 MG (50000 UT) CAPS Take 1 weekly for 12 weeks 12 capsule 0   clonazePAM  (KLONOPIN ) 0.5 MG tablet TAKE 1 TABLET(0.5 MG) BY MOUTH TWICE DAILY AS NEEDED FOR ANXIETY OR DIFFICULTY SLEEPING 30 tablet 3   drospirenone -ethinyl estradiol  (YAZ) 3-0.02 MG tablet TAKE 1 TABLET BY MOUTH DAILY, TAKE CONTINUOUSLY, AND SKIP THE REMINDING TABLETS AT THE END OF THE PACK 112 tablet 3   FLUoxetine   (PROZAC ) 20 MG capsule Take 3 capsules (60 mg total) by mouth daily. 270 capsule 3   ibuprofen (ADVIL,MOTRIN) 200 MG tablet Take 200 mg by mouth daily as needed.     loratadine (CLARITIN) 10 MG tablet Take 10 mg by mouth daily.     Multiple Vitamin (MULTIVITAMIN) capsule Take 1 capsule by mouth daily.     No current facility-administered medications on file prior to visit.    Review of Systems:  As per HPI- otherwise negative.   Physical Examination: There were no vitals filed for this visit. There were no vitals filed for this visit. There is no height or weight on file to calculate BMI. Ideal Body Weight:   Patient is here via MyChart video.  She looks well, but she is coughing occasionally during conversation No shortness of breath or distress is noted Assessment and Plan: Acute recurrent frontal sinusitis - Plan: amoxicillin -clavulanate (AUGMENTIN ) 875-125 MG tablet  Cough, unspecified type - Plan: HYDROcodone  bit-homatropine (HYCODAN) 5-1.5 MG/5ML syrup Patient seen today with concern of cough and sinus congestion for about 2 weeks.  She has been seen in urgent care and tested negative for COVID and flu.  At this point we will treat her with antibiotics, called in Augmentin .  Also called in hydrocodone  cough syrup to use as needed for cough.  Cautioned her this can cause sedation, do not use when driving.  She will let me know if not feeling better in the next few days, sooner if worse  Signed Harlene Schroeder, MD

## 2023-12-20 ENCOUNTER — Other Ambulatory Visit: Payer: Self-pay | Admitting: Family Medicine

## 2023-12-20 DIAGNOSIS — F320 Major depressive disorder, single episode, mild: Secondary | ICD-10-CM

## 2024-01-06 ENCOUNTER — Encounter: Payer: 59 | Admitting: Family Medicine

## 2024-01-29 ENCOUNTER — Other Ambulatory Visit: Payer: Self-pay | Admitting: Family Medicine

## 2024-01-29 DIAGNOSIS — F320 Major depressive disorder, single episode, mild: Secondary | ICD-10-CM

## 2024-01-29 DIAGNOSIS — F411 Generalized anxiety disorder: Secondary | ICD-10-CM

## 2024-02-28 ENCOUNTER — Other Ambulatory Visit: Payer: Self-pay | Admitting: Family Medicine

## 2024-02-28 DIAGNOSIS — F411 Generalized anxiety disorder: Secondary | ICD-10-CM

## 2024-02-28 DIAGNOSIS — F320 Major depressive disorder, single episode, mild: Secondary | ICD-10-CM

## 2024-03-22 ENCOUNTER — Other Ambulatory Visit: Payer: Self-pay | Admitting: Obstetrics and Gynecology

## 2024-03-23 NOTE — Telephone Encounter (Signed)
 Med refill request: BCPs Last AEX: 03/20/2023-BS Next AEX: nothing currently, recall sent per EMR for 2025.  Last MMG (if hormonal med): 09/09/2023-WNL, Cat C Refill authorized: rx pend. Msg sent to appt desk to schedule.

## 2024-03-23 NOTE — Telephone Encounter (Signed)
 Brittany Gillespie, CMA Left message for patient to call and schedule appointment.

## 2024-03-27 ENCOUNTER — Other Ambulatory Visit: Payer: Self-pay | Admitting: Family Medicine

## 2024-03-27 ENCOUNTER — Other Ambulatory Visit: Payer: Self-pay | Admitting: Obstetrics and Gynecology

## 2024-03-27 DIAGNOSIS — F411 Generalized anxiety disorder: Secondary | ICD-10-CM

## 2024-03-27 DIAGNOSIS — F320 Major depressive disorder, single episode, mild: Secondary | ICD-10-CM

## 2024-03-27 MED ORDER — FLUOXETINE HCL 20 MG PO CAPS: 60.0000 mg | ORAL_CAPSULE | Freq: Every day | ORAL | 0 refills | Status: DC

## 2024-03-27 NOTE — Telephone Encounter (Signed)
 Medication refill request: yaz Last AEX:  03-20-23 Next AEX: 04-23-24 Last MMG (if hormonal medication request): 09-09-23 birads 1:neg Refill authorized: this is a duplicate rx. Rx was sent 03-24-23 #112 with 0 refills. This rx denied

## 2024-04-02 ENCOUNTER — Encounter: Payer: Self-pay | Admitting: Family Medicine

## 2024-04-02 ENCOUNTER — Other Ambulatory Visit: Payer: Self-pay | Admitting: Family Medicine

## 2024-04-02 DIAGNOSIS — F320 Major depressive disorder, single episode, mild: Secondary | ICD-10-CM

## 2024-04-23 ENCOUNTER — Ambulatory Visit (INDEPENDENT_AMBULATORY_CARE_PROVIDER_SITE_OTHER): Payer: Self-pay | Admitting: Obstetrics and Gynecology

## 2024-04-23 ENCOUNTER — Encounter: Payer: Self-pay | Admitting: Obstetrics and Gynecology

## 2024-04-23 VITALS — BP 104/78 | HR 75 | Temp 97.9°F | Ht 64.75 in | Wt 189.0 lb

## 2024-04-23 DIAGNOSIS — Z3041 Encounter for surveillance of contraceptive pills: Secondary | ICD-10-CM

## 2024-04-23 DIAGNOSIS — Z01419 Encounter for gynecological examination (general) (routine) without abnormal findings: Secondary | ICD-10-CM

## 2024-04-23 DIAGNOSIS — Z1331 Encounter for screening for depression: Secondary | ICD-10-CM | POA: Diagnosis not present

## 2024-04-23 MED ORDER — NEXTSTELLIS 3-14.2 MG PO TABS: 1.0000 | ORAL_TABLET | Freq: Every day | ORAL | 4 refills | Status: AC

## 2024-04-23 NOTE — Assessment & Plan Note (Signed)
 Cervical cancer screening performed according to ASCCP guidelines. Encouraged annual mammogram screening Colonoscopy/cooguard UTD DXA N/A Labs and immunizations with her primary Encouraged safe sexual practices as indicated Encouraged healthy lifestyle practices with diet and exercise For patients under 50yo, I recommend 1000mg  calcium daily and 600IU of vitamin D  daily.

## 2024-04-23 NOTE — Patient Instructions (Signed)

## 2024-04-23 NOTE — Progress Notes (Signed)
 48 y.o. G0P0000 female on COC here for annual exam. Married. Husband dx'd in 2021 with bladder cancer, undergoing treatment. Undergoing treatment in [place]. [employer] company.  No LMP recorded. (Menstrual status: Oral contraceptives).   Concerns with cost of birth control - it was free, now $60 for 3 mo supply.  Overall doing well, just having difficulty with work accommodations for [employer] well. Working out less.  Abnormal bleeding: none Pelvic discharge or pain: none Breast mass, nipple discharge or skin changes : none. Prior left breat biopsy- benign Birth control: COC Last PAP:     Component Value Date/Time   DIAGPAP  03/20/2023 1635    - Negative for intraepithelial lesion or malignancy (NILM)   DIAGPAP  08/23/2020 1636    - Negative for intraepithelial lesion or malignancy (NILM)   HPVHIGH Negative 03/20/2023 1635   HPVHIGH Negative 08/23/2020 1636   ADEQPAP  03/20/2023 1635    Satisfactory for evaluation; transformation zone component ABSENT.   ADEQPAP  08/23/2020 1636    Satisfactory for evaluation; transformation zone component ABSENT.   Last mammogram: 09/09/23 BIRADS 1, density c Last colonoscopy: cologuard 2023 negative Sexually active: no  Exercising: no Smoker: no  Garment/textile technologist Visit from 04/23/2024 in [facility]  PHQ-2 Total Score 1       Flowsheet Row Office Visit from 01/03/2023 in [facility]  PHQ-9 Total Score 15       GYN HISTORY: No sig hx  OB History  Gravida Para Term Preterm AB Living  0 0 0 0 0 0  SAB IAB Ectopic Multiple Live Births  0 0 0 0 0   Past Medical History:  Diagnosis Date   Abnormal Pap smear of cervix ~2005   Allergy    Anxiety    Breast calcification, left 06/2014   Depression    PONV (postoperative nausea and vomiting)    Scoliosis    since childhood   Past Surgical History:  Procedure Laterality Date    ANTERIOR CRUCIATE LIGAMENT REPAIR Right 2003   BREAST LUMPECTOMY WITH RADIOACTIVE SEED LOCALIZATION Left 08/04/2014   Procedure: RADIOACTIVE SEED AND WIRE LOCALIZATION LUMPECTOMY LEFT BREAST;  Surgeon: Lillette Reid III, MD;  Location: [facility];  Service: General;  Laterality: Left;   BREAST SURGERY     CERVICAL BIOPSY  W/ LOOP ELECTRODE EXCISION  ~ 2005   [facility]    tooth implant     Current Outpatient Medications on File Prior to Visit  Medication Sig Dispense Refill   clonazePAM  (KLONOPIN ) 0.5 MG tablet TAKE 1 TABLET(0.5 MG) BY MOUTH TWICE DAILY AS NEEDED FOR ANXIETY OR DIFFICULTY SLEEPING 30 tablet 0   FLUoxetine  (PROZAC ) 20 MG capsule Take 3 capsules (60 mg total) by mouth daily. 270 capsule 0   ibuprofen (ADVIL,MOTRIN) 200 MG tablet Take 200 mg by mouth daily as needed.     loratadine (CLARITIN) 10 MG tablet Take 10 mg by mouth daily.     Multiple Vitamin (MULTIVITAMIN) capsule Take 1 capsule by mouth daily.     No current facility-administered medications on file prior to visit.   Social History   Socioeconomic History   Marital status: Married    Spouse name: Not on file   Number of children: Not on file   Years of education: Not on file   Highest education level: Not on file  Occupational History   Occupation: Sales  Tobacco  Use   Smoking status: Former    Current packs/day: 0.00    Types: Cigarettes    Quit date: 11/27/2011    Years since quitting: 12.4   Smokeless tobacco: Never  Vaping Use   Vaping status: Never Used  Substance and Sexual Activity   Alcohol use: Yes    Alcohol/week: 2.0 standard drinks of alcohol    Types: 2 Glasses of wine per week   Drug use: Yes    Types: Marijuana    Comment: last used 1 week ago   Sexual activity: Yes    Partners: Male    Birth control/protection: Pill    Comment: Loryna --takes continuously  Other Topics Concern   Not on file  Social History Narrative   Not on file   Social Drivers of Health    Financial Resource Strain: Not on file  Food Insecurity: Not on file  Transportation Needs: Not on file  Physical Activity: Not on file  Stress: Not on file  Social Connections: Not on file  Intimate Partner Violence: Not on file   Family History  Problem Relation Age of Onset   Alzheimer's disease Maternal Grandmother    Cancer Maternal Grandmother        breast cancer   Breast cancer Maternal Grandmother    Stroke Maternal Grandfather    Heart disease Maternal Grandfather    Glaucoma Paternal Grandmother    Diabetes Paternal Grandfather    Hypertension Mother    Bradycardia Mother        Pacemaker inserted   Mental illness Father    No Known Allergies   PE Today's Vitals   04/23/24 0831  BP: 104/78  Pulse: 75  Temp: 97.9 F (36.6 C)  TempSrc: Oral  SpO2: 97%  Weight: 189 lb (85.7 kg)  Height: 5' 4.75" (1.645 m)   Body mass index is 31.69 kg/m.  Physical Exam Vitals reviewed. Exam conducted with a chaperone present.  Constitutional:      General: She is not in acute distress.    Appearance: Normal appearance.  HENT:     Head: Normocephalic and atraumatic.     Nose: Nose normal.  Eyes:     Extraocular Movements: Extraocular movements intact.     Conjunctiva/sclera: Conjunctivae normal.  Neck:     Thyroid : No thyroid  mass, thyromegaly or thyroid  tenderness.  Pulmonary:     Effort: Pulmonary effort is normal.  Chest:     Chest wall: No mass or tenderness.  Breasts:    Right: Normal. No swelling, mass, nipple discharge, skin change or tenderness.     Left: Normal. No swelling, mass, nipple discharge, skin change or tenderness.  Abdominal:     General: There is no distension.     Palpations: Abdomen is soft.     Tenderness: There is no abdominal tenderness.  Genitourinary:    General: Normal vulva.     Exam position: Lithotomy position.     Urethra: No prolapse.     Vagina: Normal. No vaginal discharge or bleeding.     Cervix: Normal. No lesion.      Uterus: Normal. Not enlarged and not tender.      Adnexa: Right adnexa normal and left adnexa normal.  Musculoskeletal:        General: Normal range of motion.     Cervical back: Normal range of motion.  Lymphadenopathy:     Upper Body:     Right upper body: No axillary adenopathy.     Left upper  body: No axillary adenopathy.     Lower Body: No right inguinal adenopathy. No left inguinal adenopathy.  Skin:    General: Skin is warm and dry.  Neurological:     General: No focal deficit present.     Mental Status: She is alert.  Psychiatric:        Mood and Affect: Mood normal.        Behavior: Behavior normal.      Assessment and Plan:        Well woman exam with routine gynecological exam Assessment & Plan: Cervical cancer screening performed according to ASCCP guidelines. Encouraged annual mammogram screening Colonoscopy/cooguard UTD DXA N/A Labs and immunizations with her primary Encouraged safe sexual practices as indicated Encouraged healthy lifestyle practices with diet and exercise For patients under 50yo, I recommend 1000mg  calcium daily and 600IU of vitamin D  daily. Will switch to nextstellis for $0 copay through myscripts, reviewed different estrogen. May notice some hormonal side effects  Oral contraceptive pill surveillance -     Nextstellis; Take 1 tablet by mouth daily.  Dispense: 84 tablet; Refill: 4  Negative depression screening   Romaine Closs, MD

## 2024-05-03 NOTE — Patient Instructions (Incomplete)
 It was great to see you again today, I will be in touch with your blood work

## 2024-05-03 NOTE — Progress Notes (Unsigned)
 [facility] [address] [place], [place] [address] [phone]  Date:  05/06/2024   Name:  Brittany Gillespie   DOB:  07-Aug-1976   MRN:  [id number]  PCP:  Kaylee Partridge, MD    Chief Complaint: No chief complaint on file.   History of Present Illness:  Brittany Gillespie is a 48 y.o. very pleasant female patient who presents with the following:  Patient seen today for physical exam.  Most recent visit with myself was a virtual visit in January when she was dealing with a sinus infection  Generally good health except for depression and anxiety symptoms, vitamin D  deficiency She is currently using fluoxetine  60 mg and as needed clonazepam  Birth control pills per her GYN Married, no children.  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   Overweight 09/17/2016   Breast calcification, left 07/22/2014    Past Medical History:  Diagnosis Date   Abnormal Pap smear of cervix ~2005   Allergy    Anxiety    Breast calcification, left 06/2014   Depression    PONV (postoperative nausea and vomiting)    Scoliosis    since childhood    Past Surgical History:  Procedure Laterality Date   ANTERIOR CRUCIATE LIGAMENT REPAIR Right 2003   BREAST LUMPECTOMY WITH RADIOACTIVE SEED LOCALIZATION Left 08/04/2014   Procedure: RADIOACTIVE SEED AND WIRE LOCALIZATION LUMPECTOMY LEFT BREAST;  Surgeon: Lillette Reid III, MD;  Location: [facility];  Service: General;  Laterality: Left;   BREAST SURGERY     CERVICAL BIOPSY  W/ LOOP ELECTRODE EXCISION  ~ 2005   [facility]    tooth implant       Social History   Tobacco Use   Smoking status: Former    Current packs/day: 0.00    Types: Cigarettes    Quit date: 11/27/2011    Years since quitting: 12.4   Smokeless tobacco: Never  Vaping Use   Vaping status: Never Used  Substance Use Topics   Alcohol use: Yes    Alcohol/week: 2.0 standard drinks of alcohol    Types: 2 Glasses of wine per week   Drug use: Yes    Types: Marijuana    Comment: last used 1 week ago    Family History  Problem Relation Age of Onset   Alzheimer's disease Maternal Grandmother    Cancer Maternal Grandmother        breast cancer   Breast cancer Maternal Grandmother    Stroke Maternal Grandfather    Heart disease Maternal Grandfather    Glaucoma Paternal Grandmother    Diabetes Paternal Grandfather    Hypertension Mother    Bradycardia Mother        Pacemaker inserted   Mental illness Father     No Known Allergies  Medication list has been reviewed and updated.  Current Outpatient Medications on File Prior to Visit  Medication Sig Dispense Refill   clonazePAM  (KLONOPIN ) 0.5 MG tablet TAKE 1 TABLET(0.5 MG) BY MOUTH TWICE DAILY AS NEEDED FOR ANXIETY OR DIFFICULTY SLEEPING 30 tablet 0   Drospirenone -Estetrol (  NEXTSTELLIS ) 3-14.2 MG TABS Take 1 tablet by mouth daily. 84 tablet 4   FLUoxetine  (PROZAC ) 20 MG capsule Take 3 capsules (60 mg total) by mouth daily. 270 capsule 0   ibuprofen (ADVIL,MOTRIN) 200 MG tablet Take 200 mg by mouth daily as needed.     loratadine (CLARITIN) 10 MG tablet Take 10 mg by mouth daily.     Multiple Vitamin (MULTIVITAMIN) capsule Take 1 capsule by mouth daily.     No current facility-administered medications on file prior to visit.    Review of Systems:  ***  Physical Examination: There were no vitals filed for this visit. There were no vitals filed for this visit. There is no height or weight on file to calculate BMI. Ideal Body Weight:    ***  Assessment and Plan: ***  Signed Gates Kasal,  MD

## 2024-05-06 ENCOUNTER — Ambulatory Visit (INDEPENDENT_AMBULATORY_CARE_PROVIDER_SITE_OTHER): Admitting: Family Medicine

## 2024-05-06 ENCOUNTER — Encounter: Payer: Self-pay | Admitting: Family Medicine

## 2024-05-06 VITALS — BP 128/82 | HR 77 | Ht 64.75 in | Wt 193.4 lb

## 2024-05-06 DIAGNOSIS — Z13220 Encounter for screening for lipoid disorders: Secondary | ICD-10-CM

## 2024-05-06 DIAGNOSIS — Z0000 Encounter for general adult medical examination without abnormal findings: Secondary | ICD-10-CM

## 2024-05-06 DIAGNOSIS — E559 Vitamin D deficiency, unspecified: Secondary | ICD-10-CM

## 2024-05-06 DIAGNOSIS — F411 Generalized anxiety disorder: Secondary | ICD-10-CM

## 2024-05-06 DIAGNOSIS — Z131 Encounter for screening for diabetes mellitus: Secondary | ICD-10-CM

## 2024-05-06 DIAGNOSIS — Z130 Encounter for screening for diseases of the blood and blood-forming organs and certain disorders involving the immune mechanism: Secondary | ICD-10-CM

## 2024-05-06 DIAGNOSIS — Z1329 Encounter for screening for other suspected endocrine disorder: Secondary | ICD-10-CM

## 2024-05-06 DIAGNOSIS — F320 Major depressive disorder, single episode, mild: Secondary | ICD-10-CM

## 2024-05-06 MED ORDER — CLONAZEPAM 0.5 MG PO TABS: ORAL_TABLET | ORAL | 1 refills | Status: DC

## 2024-05-06 MED ORDER — FLUOXETINE HCL 20 MG PO CAPS: 60.0000 mg | ORAL_CAPSULE | Freq: Every day | ORAL | 3 refills | Status: AC

## 2024-05-07 ENCOUNTER — Encounter: Payer: Self-pay | Admitting: Family Medicine

## 2024-05-07 LAB — VITAMIN D 25 HYDROXY (VIT D DEFICIENCY, FRACTURES): Vit D, 25-Hydroxy: 31 ng/mL (ref 30–100)

## 2024-05-07 LAB — LIPID PANEL
Cholesterol: 181 mg/dL (ref <200)
HDL: 91 mg/dL (ref >=50)
LDL Cholesterol (Calc): 70 mg/dL
Non-HDL Cholesterol (Calc): 90 mg/dL (ref <130)
Total CHOL/HDL Ratio: 2 (calc) (ref <5.0)
Triglycerides: 123 mg/dL (ref <150)

## 2024-05-07 LAB — COMPREHENSIVE METABOLIC PANEL WITH GFR
AG Ratio: 1.4 (calc) (ref 1.0–2.5)
ALT: 11 U/L (ref 6–29)
AST: 13 U/L (ref 10–35)
Albumin: 4 g/dL (ref 3.6–5.1)
Alkaline phosphatase (APISO): 66 U/L (ref 31–125)
BUN: 14 mg/dL (ref 7–25)
CO2: 27 mmol/L (ref 20–32)
Calcium: 9.1 mg/dL (ref 8.6–10.2)
Chloride: 101 mmol/L (ref 98–110)
Creat: 0.74 mg/dL (ref 0.50–0.99)
Globulin: 2.9 g/dL (ref 1.9–3.7)
Glucose, Bld: 132 mg/dL — ABNORMAL HIGH (ref 65–99)
Potassium: 4 mmol/L (ref 3.5–5.3)
Sodium: 136 mmol/L (ref 135–146)
Total Bilirubin: 0.3 mg/dL (ref 0.2–1.2)
Total Protein: 6.9 g/dL (ref 6.1–8.1)
eGFR: 99 mL/min/{1.73_m2} (ref >=60)

## 2024-05-07 LAB — TSH: TSH: 2 m[IU]/L

## 2024-05-07 LAB — CBC
HCT: 34.5 % — ABNORMAL LOW (ref 35.0–45.0)
Hemoglobin: 11 g/dL — ABNORMAL LOW (ref 11.7–15.5)
MCH: 28.4 pg (ref 27.0–33.0)
MCHC: 31.9 g/dL — ABNORMAL LOW (ref 32.0–36.0)
MCV: 89.1 fL (ref 80.0–100.0)
MPV: 9.8 fL (ref 7.5–12.5)
Platelets: 335 10*3/uL (ref 140–400)
RBC: 3.87 10*6/uL (ref 3.80–5.10)
RDW: 13.1 % (ref 11.0–15.0)
WBC: 8.9 10*3/uL (ref 3.8–10.8)

## 2024-05-07 LAB — HEMOGLOBIN A1C
Hgb A1c MFr Bld: 5.6 % (ref <5.7)
Mean Plasma Glucose: 114 mg/dL
eAG (mmol/L): 6.3 mmol/L

## 2024-06-29 ENCOUNTER — Other Ambulatory Visit: Payer: Self-pay | Admitting: Family Medicine

## 2024-06-29 DIAGNOSIS — F320 Major depressive disorder, single episode, mild: Secondary | ICD-10-CM

## 2024-06-29 DIAGNOSIS — F411 Generalized anxiety disorder: Secondary | ICD-10-CM

## 2024-11-09 ENCOUNTER — Other Ambulatory Visit: Payer: Self-pay | Admitting: Family Medicine

## 2024-11-09 DIAGNOSIS — F320 Major depressive disorder, single episode, mild: Secondary | ICD-10-CM

## 2024-11-10 NOTE — Progress Notes (Unsigned)
 [facility] [address] [place], [place] [address] [phone]  Date:  11/11/2024   Name:  Brittany Gillespie   DOB:  13-Dec-1975   MRN:  [id number]  PCP:  Watt Harlene BROCKS, MD    Chief Complaint: No chief complaint on file.   History of Present Illness:  Brittany Gillespie is a 48 y.o. very pleasant female patient who presents with the following:  Pt is seen today virtually for a medication check Patient location is home, location is office.  Patient identity confirmed with 2 factors, she gives consent for virtual visit today.  The patient and myself are present on the call today  Most recent visit with myself was in June She is generally healthy except for history of depression and anxiety, vitamin D  deficiency Her husband has history of bladder cancer She has gynecology care  I am treating her with fluoxetine  60 mg daily and clonazepam  0.5 as needed  Discussed the use of AI scribe software for clinical note transcription with the patient, who gave verbal consent to proceed.  History of Present Illness     Patient Active Problem List   Diagnosis Date Noted   Well woman exam with routine gynecological exam 04/23/2024   Knee pain, left 09/24/2016   Insomnia 09/17/2016   Mild depression 09/17/2016   Overweight 09/17/2016   Breast calcification, left 07/22/2014    Past Medical History:  Diagnosis Date   Abnormal Pap smear of cervix ~2005   Allergy    Anxiety    Breast calcification, left 06/2014   Depression    PONV (postoperative nausea and vomiting)    Scoliosis    since childhood    Past Surgical History:  Procedure Laterality Date   ANTERIOR CRUCIATE LIGAMENT REPAIR Right 2003   BREAST LUMPECTOMY WITH RADIOACTIVE SEED LOCALIZATION Left 08/04/2014   Procedure: RADIOACTIVE SEED AND WIRE LOCALIZATION LUMPECTOMY LEFT BREAST;  Surgeon: Deward Null III, MD;  Location: [facility];  Service:  General;  Laterality: Left;   BREAST SURGERY     CERVICAL BIOPSY  W/ LOOP ELECTRODE EXCISION  ~ 2005   [facility]    tooth implant      Social History[1]  Family History  Problem Relation Age of Onset   Alzheimer's disease Maternal Grandmother    Cancer Maternal Grandmother        breast cancer   Breast cancer Maternal Grandmother    Stroke Maternal Grandfather    Heart disease Maternal Grandfather    Glaucoma Paternal Grandmother    Diabetes Paternal Grandfather    Hypertension Mother    Bradycardia Mother        Pacemaker inserted   Mental illness Father     Allergies[2]  Medication list has been reviewed and updated.  Medications Ordered Prior to Encounter[3]  Review of Systems:  As per HPI- otherwise negative.   Physical Examination: There were no vitals filed for this visit. There were no vitals filed for this visit. There is no height or weight on file to calculate BMI. Ideal Body Weight:    ***  Assessment and Plan: No diagnosis found.  Assessment & Plan   Signed Harlene Watt, MD    [1]  Social History Tobacco Use   Smoking status: Former    Current packs/day: 0.00    Types: Cigarettes    Quit date: 11/27/2011    Years since quitting: 12.9   Smokeless  tobacco: Never  Vaping Use   Vaping status: Never Used  Substance Use Topics   Alcohol use: Yes    Alcohol/week: 2.0 standard drinks of alcohol    Types: 2 Glasses of wine per week   Drug use: Yes    Types: Marijuana    Comment: last used 1 week ago  [2] No Known Allergies [3]  Current Outpatient Medications on File Prior to Visit  Medication Sig Dispense Refill   clonazePAM  (KLONOPIN ) 0.5 MG tablet TAKE 1 TABLET(0.5 MG) BY MOUTH TWICE DAILY AS NEEDED FOR ANXIETY OR DIFFICULTY SLEEPING 90 tablet 0   Drospirenone -Estetrol (NEXTSTELLIS ) 3-14.2 MG TABS Take 1 tablet by mouth daily. 84 tablet 4   FLUoxetine  (PROZAC ) 20 MG capsule Take 3 capsules (60 mg total) by mouth daily.  270 capsule 3   ibuprofen (ADVIL,MOTRIN) 200 MG tablet Take 200 mg by mouth daily as needed.     loratadine (CLARITIN) 10 MG tablet Take 10 mg by mouth daily.     Multiple Vitamin (MULTIVITAMIN) capsule Take 1 capsule by mouth daily.     No current facility-administered medications on file prior to visit.

## 2024-11-11 ENCOUNTER — Telehealth (INDEPENDENT_AMBULATORY_CARE_PROVIDER_SITE_OTHER): Admitting: Family Medicine

## 2024-11-11 VITALS — Ht 64.75 in

## 2024-11-11 DIAGNOSIS — Z6825 Body mass index (BMI) 25.0-25.9, adult: Secondary | ICD-10-CM

## 2024-11-11 DIAGNOSIS — E669 Obesity, unspecified: Secondary | ICD-10-CM

## 2024-11-11 DIAGNOSIS — F411 Generalized anxiety disorder: Secondary | ICD-10-CM
